# Patient Record
Sex: Female | Born: 1965 | Race: White | Hispanic: No | Smoking: Current every day smoker
Health system: Southern US, Community
[De-identification: ages and names within clinical notes are randomized; demographics above are authoritative.]

## PROBLEM LIST (undated history)

## (undated) DIAGNOSIS — I10 Essential (primary) hypertension: Secondary | ICD-10-CM

---

## 2010-11-21 ENCOUNTER — Emergency Department: Payer: Self-pay | Admitting: Emergency Medicine

## 2021-05-26 ENCOUNTER — Emergency Department: Payer: Self-pay

## 2021-05-26 ENCOUNTER — Inpatient Hospital Stay: Payer: Self-pay

## 2021-05-26 ENCOUNTER — Other Ambulatory Visit: Payer: Self-pay

## 2021-05-26 ENCOUNTER — Inpatient Hospital Stay
Admission: EM | Admit: 2021-05-26 | Discharge: 2021-06-03 | DRG: 871 | Disposition: A | Discharge status: Home / Self Care | Payer: Self-pay | Attending: Internal Medicine | Admitting: Internal Medicine

## 2021-05-26 ENCOUNTER — Encounter: Payer: Self-pay | Admitting: Radiology

## 2021-05-26 DIAGNOSIS — R55 Syncope and collapse: Secondary | ICD-10-CM

## 2021-05-26 DIAGNOSIS — F17210 Nicotine dependence, cigarettes, uncomplicated: Secondary | ICD-10-CM | POA: Diagnosis present

## 2021-05-26 DIAGNOSIS — J9601 Acute respiratory failure with hypoxia: Secondary | ICD-10-CM | POA: Diagnosis present

## 2021-05-26 DIAGNOSIS — R197 Diarrhea, unspecified: Secondary | ICD-10-CM | POA: Diagnosis present

## 2021-05-26 DIAGNOSIS — G4700 Insomnia, unspecified: Secondary | ICD-10-CM | POA: Diagnosis present

## 2021-05-26 DIAGNOSIS — E876 Hypokalemia: Secondary | ICD-10-CM | POA: Diagnosis present

## 2021-05-26 DIAGNOSIS — E162 Hypoglycemia, unspecified: Secondary | ICD-10-CM | POA: Diagnosis present

## 2021-05-26 DIAGNOSIS — Z8249 Family history of ischemic heart disease and other diseases of the circulatory system: Secondary | ICD-10-CM

## 2021-05-26 DIAGNOSIS — Z8042 Family history of malignant neoplasm of prostate: Secondary | ICD-10-CM

## 2021-05-26 DIAGNOSIS — M109 Gout, unspecified: Secondary | ICD-10-CM | POA: Diagnosis present

## 2021-05-26 DIAGNOSIS — R0781 Pleurodynia: Secondary | ICD-10-CM

## 2021-05-26 DIAGNOSIS — Z28310 Unvaccinated for covid-19: Secondary | ICD-10-CM

## 2021-05-26 DIAGNOSIS — J449 Chronic obstructive pulmonary disease, unspecified: Secondary | ICD-10-CM | POA: Diagnosis present

## 2021-05-26 DIAGNOSIS — F17200 Nicotine dependence, unspecified, uncomplicated: Secondary | ICD-10-CM | POA: Diagnosis present

## 2021-05-26 DIAGNOSIS — N390 Urinary tract infection, site not specified: Secondary | ICD-10-CM | POA: Diagnosis present

## 2021-05-26 DIAGNOSIS — Z79899 Other long term (current) drug therapy: Secondary | ICD-10-CM

## 2021-05-26 DIAGNOSIS — J852 Abscess of lung without pneumonia: Secondary | ICD-10-CM

## 2021-05-26 DIAGNOSIS — N3001 Acute cystitis with hematuria: Secondary | ICD-10-CM | POA: Diagnosis present

## 2021-05-26 DIAGNOSIS — A419 Sepsis, unspecified organism: Principal | ICD-10-CM | POA: Diagnosis present

## 2021-05-26 DIAGNOSIS — J90 Pleural effusion, not elsewhere classified: Secondary | ICD-10-CM | POA: Diagnosis present

## 2021-05-26 DIAGNOSIS — I10 Essential (primary) hypertension: Secondary | ICD-10-CM | POA: Diagnosis present

## 2021-05-26 DIAGNOSIS — Z938 Other artificial opening status: Secondary | ICD-10-CM

## 2021-05-26 DIAGNOSIS — J869 Pyothorax without fistula: Secondary | ICD-10-CM | POA: Diagnosis present

## 2021-05-26 DIAGNOSIS — I493 Ventricular premature depolarization: Secondary | ICD-10-CM | POA: Diagnosis present

## 2021-05-26 DIAGNOSIS — Z20822 Contact with and (suspected) exposure to covid-19: Secondary | ICD-10-CM | POA: Diagnosis present

## 2021-05-26 DIAGNOSIS — Z9049 Acquired absence of other specified parts of digestive tract: Secondary | ICD-10-CM

## 2021-05-26 HISTORY — DX: Essential (primary) hypertension: I10

## 2021-05-26 LAB — RESP PANEL BY RT-PCR (FLU A&B, COVID) ARPGX2
Influenza A by PCR: NEGATIVE
Influenza B by PCR: NEGATIVE
SARS Coronavirus 2 by RT PCR: NEGATIVE

## 2021-05-26 LAB — CBC WITH DIFFERENTIAL/PLATELET
Abs Immature Granulocytes: 0.73 10*3/uL — ABNORMAL HIGH (ref 0.00–0.07)
Basophils Absolute: 0.1 10*3/uL (ref 0.0–0.1)
Basophils Relative: 0 %
Eosinophils Absolute: 0 10*3/uL (ref 0.0–0.5)
Eosinophils Relative: 0 %
HCT: 38 % (ref 36.0–46.0)
Hemoglobin: 13.3 g/dL (ref 12.0–15.0)
Immature Granulocytes: 3 %
Lymphocytes Relative: 8 %
Lymphs Abs: 2 10*3/uL (ref 0.7–4.0)
MCH: 33.3 pg (ref 26.0–34.0)
MCHC: 35 g/dL (ref 30.0–36.0)
MCV: 95 fL (ref 80.0–100.0)
Monocytes Absolute: 2 10*3/uL — ABNORMAL HIGH (ref 0.1–1.0)
Monocytes Relative: 8 %
Neutro Abs: 19.4 10*3/uL — ABNORMAL HIGH (ref 1.7–7.7)
Neutrophils Relative %: 81 %
Platelets: 374 10*3/uL (ref 150–400)
RBC: 4 MIL/uL (ref 3.87–5.11)
RDW: 12.4 % (ref 11.5–15.5)
WBC: 24.2 10*3/uL — ABNORMAL HIGH (ref 4.0–10.5)
nRBC: 0 % (ref 0.0–0.2)

## 2021-05-26 LAB — COMPREHENSIVE METABOLIC PANEL
ALT: 19 U/L (ref 0–44)
AST: 18 U/L (ref 15–41)
Albumin: 2.6 g/dL — ABNORMAL LOW (ref 3.5–5.0)
Alkaline Phosphatase: 132 U/L — ABNORMAL HIGH (ref 38–126)
Anion gap: 13 (ref 5–15)
BUN: 8 mg/dL (ref 6–20)
CO2: 32 mmol/L (ref 22–32)
Calcium: 9 mg/dL (ref 8.9–10.3)
Chloride: 85 mmol/L — ABNORMAL LOW (ref 98–111)
Creatinine, Ser: 0.57 mg/dL (ref 0.44–1.00)
GFR, Estimated: >60 mL/min (ref >60)
Glucose, Bld: 136 mg/dL — ABNORMAL HIGH (ref 70–99)
Potassium: 2.4 mmol/L — CL (ref 3.5–5.1)
Sodium: 130 mmol/L — ABNORMAL LOW (ref 135–145)
Total Bilirubin: 0.8 mg/dL (ref 0.3–1.2)
Total Protein: 6.8 g/dL (ref 6.5–8.1)

## 2021-05-26 LAB — URINALYSIS, COMPLETE (UACMP) WITH MICROSCOPIC
Bacteria, UA: NONE SEEN
Bilirubin Urine: NEGATIVE
Glucose, UA: NEGATIVE mg/dL
Ketones, ur: NEGATIVE mg/dL
Nitrite: NEGATIVE
Protein, ur: NEGATIVE mg/dL
Specific Gravity, Urine: 1.033 — ABNORMAL HIGH (ref 1.005–1.030)
pH: 7 (ref 5.0–8.0)

## 2021-05-26 LAB — TROPONIN I (HIGH SENSITIVITY)
Troponin I (High Sensitivity): 29 ng/L — ABNORMAL HIGH (ref <18)
Troponin I (High Sensitivity): 9 ng/L (ref <18)

## 2021-05-26 LAB — MAGNESIUM: Magnesium: 2 mg/dL (ref 1.7–2.4)

## 2021-05-26 LAB — LACTIC ACID, PLASMA
Lactic Acid, Venous: 1.8 mmol/L (ref 0.5–1.9)
Lactic Acid, Venous: 1.9 mmol/L (ref 0.5–1.9)

## 2021-05-26 LAB — LIPASE, BLOOD: Lipase: 21 U/L (ref 11–51)

## 2021-05-26 LAB — D-DIMER, QUANTITATIVE: D-Dimer, Quant: 6.95 ug/mL-FEU — ABNORMAL HIGH (ref 0.00–0.50)

## 2021-05-26 LAB — BRAIN NATRIURETIC PEPTIDE: B Natriuretic Peptide: 364.7 pg/mL — ABNORMAL HIGH (ref 0.0–100.0)

## 2021-05-26 MED ORDER — ENOXAPARIN SODIUM 40 MG/0.4ML IJ SOSY
40.0000 mg | PREFILLED_SYRINGE | INTRAMUSCULAR | Status: DC
  Administered 2021-05-26 – 2021-06-02 (×8): 40 mg via SUBCUTANEOUS
  Filled 2021-05-26 (×8): qty 0.4

## 2021-05-26 MED ORDER — IOHEXOL 350 MG/ML SOLN
75.0000 mL | Freq: Once | INTRAVENOUS | Status: AC | PRN
  Administered 2021-05-26: 75 mL via INTRAVENOUS

## 2021-05-26 MED ORDER — SODIUM CHLORIDE 0.9 % IV SOLN
2.0000 g | INTRAVENOUS | Status: DC
  Administered 2021-05-26 – 2021-05-28 (×3): 2 g via INTRAVENOUS
  Filled 2021-05-26: qty 20
  Filled 2021-05-26: qty 2
  Filled 2021-05-26: qty 20
  Filled 2021-05-26: qty 2

## 2021-05-26 MED ORDER — SODIUM CHLORIDE 0.9 % IV BOLUS
1000.0000 mL | Freq: Once | INTRAVENOUS | Status: AC
  Administered 2021-05-26: 1000 mL via INTRAVENOUS

## 2021-05-26 MED ORDER — VANCOMYCIN HCL IN DEXTROSE 1-5 GM/200ML-% IV SOLN
1000.0000 mg | Freq: Once | INTRAVENOUS | Status: AC
  Administered 2021-05-26: 1000 mg via INTRAVENOUS
  Filled 2021-05-26: qty 200

## 2021-05-26 MED ORDER — MIDAZOLAM HCL 5 MG/5ML IJ SOLN
INTRAMUSCULAR | Status: AC
  Filled 2021-05-26: qty 5

## 2021-05-26 MED ORDER — POTASSIUM CHLORIDE 10 MEQ/100ML IV SOLN
10.0000 meq | INTRAVENOUS | Status: AC
  Administered 2021-05-26 (×4): 10 meq via INTRAVENOUS
  Filled 2021-05-26 (×2): qty 100

## 2021-05-26 MED ORDER — ONDANSETRON HCL 4 MG/2ML IJ SOLN: 4.0000 mg | Freq: Four times a day (QID) | INTRAMUSCULAR | Status: DC | PRN

## 2021-05-26 MED ORDER — ONDANSETRON HCL 4 MG PO TABS: 4.0000 mg | ORAL_TABLET | Freq: Four times a day (QID) | ORAL | Status: DC | PRN

## 2021-05-26 MED ORDER — ONDANSETRON HCL 4 MG/2ML IJ SOLN
4.0000 mg | Freq: Once | INTRAMUSCULAR | Status: AC
  Administered 2021-05-26: 4 mg via INTRAVENOUS
  Filled 2021-05-26: qty 2

## 2021-05-26 MED ORDER — SODIUM CHLORIDE 0.9 % IV SOLN
2.0000 g | Freq: Once | INTRAVENOUS | Status: AC
  Administered 2021-05-26: 2 g via INTRAVENOUS
  Filled 2021-05-26: qty 2

## 2021-05-26 MED ORDER — MIDAZOLAM HCL 2 MG/2ML IJ SOLN
INTRAMUSCULAR | Status: AC | PRN
  Administered 2021-05-26: 1 mg via INTRAVENOUS

## 2021-05-26 MED ORDER — NICOTINE 14 MG/24HR TD PT24
14.0000 mg | MEDICATED_PATCH | Freq: Every day | TRANSDERMAL | Status: DC
  Administered 2021-05-26 – 2021-06-03 (×9): 14 mg via TRANSDERMAL
  Filled 2021-05-26 (×9): qty 1

## 2021-05-26 MED ORDER — SENNA 8.6 MG PO TABS
1.0000 | ORAL_TABLET | Freq: Two times a day (BID) | ORAL | Status: DC
  Administered 2021-05-26 – 2021-05-27 (×3): 8.6 mg via ORAL
  Filled 2021-05-26 (×3): qty 1

## 2021-05-26 MED ORDER — METRONIDAZOLE 500 MG/100ML IV SOLN
500.0000 mg | Freq: Three times a day (TID) | INTRAVENOUS | Status: DC
  Administered 2021-05-26 – 2021-05-29 (×10): 500 mg via INTRAVENOUS
  Filled 2021-05-26 (×12): qty 100

## 2021-05-26 MED ORDER — FENTANYL CITRATE (PF) 100 MCG/2ML IJ SOLN
INTRAMUSCULAR | Status: AC
  Filled 2021-05-26: qty 2

## 2021-05-26 MED ORDER — POTASSIUM CHLORIDE CRYS ER 20 MEQ PO TBCR
40.0000 meq | EXTENDED_RELEASE_TABLET | Freq: Two times a day (BID) | ORAL | Status: AC
  Administered 2021-05-26 (×2): 40 meq via ORAL
  Filled 2021-05-26 (×2): qty 2

## 2021-05-26 MED ORDER — KETOROLAC TROMETHAMINE 15 MG/ML IJ SOLN
15.0000 mg | Freq: Four times a day (QID) | INTRAMUSCULAR | Status: AC | PRN
  Administered 2021-05-26 – 2021-05-29 (×5): 15 mg via INTRAVENOUS
  Filled 2021-05-26 (×7): qty 1

## 2021-05-26 MED ORDER — SODIUM CHLORIDE 0.9 % IV SOLN: 500.0000 mg | INTRAVENOUS | Status: DC

## 2021-05-26 MED ORDER — LACTATED RINGERS IV SOLN: INTRAVENOUS | Status: DC

## 2021-05-26 MED ORDER — LIDOCAINE 5 % EX PTCH
1.0000 | MEDICATED_PATCH | CUTANEOUS | Status: DC
  Administered 2021-05-26 – 2021-06-03 (×9): 1 via TRANSDERMAL
  Filled 2021-05-26 (×10): qty 1

## 2021-05-26 MED ORDER — FENTANYL CITRATE (PF) 100 MCG/2ML IJ SOLN
INTRAMUSCULAR | Status: AC | PRN
  Administered 2021-05-26: 50 ug via INTRAVENOUS

## 2021-05-26 MED ORDER — POTASSIUM CHLORIDE CRYS ER 20 MEQ PO TBCR
40.0000 meq | EXTENDED_RELEASE_TABLET | Freq: Once | ORAL | Status: AC
  Administered 2021-05-26: 40 meq via ORAL
  Filled 2021-05-26: qty 2

## 2021-05-26 MED ORDER — FENTANYL CITRATE (PF) 100 MCG/2ML IJ SOLN
50.0000 ug | Freq: Once | INTRAMUSCULAR | Status: AC
Start: 2021-05-26 — End: 2021-05-26
  Administered 2021-05-26: 50 ug via INTRAVENOUS
  Filled 2021-05-26: qty 2

## 2021-05-26 NOTE — ED Triage Notes (Signed)
Pt to ED with daughter from home POV Pt c/o R flank pain since 1 week ago. Pt had gout attack in feet 3 weeks ago. Daughter states pt has had to sleep in recliner because is SOB since 1 week ago Pt states she feels "winded".  Pt smokes "2-3" cigarettes per day. Hx HTN.  Denies pain, burning with urination. Denies CP.

## 2021-05-26 NOTE — ED Notes (Signed)
Placed pt on 2L oxygen. SPO2 was dropping to 81-83% on RA with good waveform. SPO2 95% on 2 L.

## 2021-05-26 NOTE — Plan of Care (Signed)

## 2021-05-26 NOTE — ED Notes (Signed)
EDP has seen and talked to pt. Pt states before felt sick, was smoking 1/2 pack cigarettes/day.

## 2021-05-26 NOTE — Consult Note (Signed)
PHARMACY -  BRIEF ANTIBIOTIC NOTE   Pharmacy has received consult(s) for Cefepime and Vancomycin from an ED provider.  The patient's profile has been reviewed for ht/wt/allergies/indication/available labs.    One time order(s) placed for Cefepime 2g IV and Vancomycin 1g IV x 1 dose each.  Further antibiotics/pharmacy consults should be ordered by admitting physician if indicated.                       Thank you, Bettey Costa 05/26/2021  11:15 AM

## 2021-05-26 NOTE — ED Notes (Signed)
Pt to CT

## 2021-05-26 NOTE — Procedures (Signed)
Interventional Radiology Procedure Note  Procedure: CT guided right chest tube placement  Findings: Please refer to procedural dictation for full description. Right mid-axillary 14 Fr pigtail thoracotomy placed.  Approximately 60 mL thick, yellow, purulent fluid aspirated immediately.  Sample sent for culture.  Placed to pleurevac. No air leak.  Complications: None immediate.  Estimated Blood Loss: < 5 mL  Recommendations: Keep to wall suction (-40 mmHg). IR will follow. Follow up cultures.   Marliss Coots, MD

## 2021-05-26 NOTE — ED Notes (Signed)
Pt to IR for empyema drainage.

## 2021-05-26 NOTE — H&P (Signed)
History and Physical    TANESSA TIDD HBZ:[id number] DOB: [birth year] DOA: 05/26/2021  PCP: Dortha Kern, MD   Patient coming from: Home  I have personally briefly reviewed patient's old medical records in [facility]  Chief Complaint: Right lateral chest wall pain/shortness of breath  HPI: Tonya Johnson is a 55 y.o. female with medical history significant for gout and hypertension who presents to the emergency room for evaluation of shortness of breath as well as right lateral chest wall pain which she has had for over a week. Patient states that she developed severe pain in the right lateral chest wall and had gone to see her primary care provider in who placed her on medications for suspected kidney stone.  Patient was placed on Flomax and oxycodone without any improvement in her symptoms. Right lateral chest wall pain is pleuritic is described as severe, she rates it an 8 x 10 in intensity at its worst.  Pain is nonradiating.  She also complains of shortness of breath which occurs at rest but is worse with exertion. She decided to present to the ER due to persistence of her symptoms. She complains of anorexia and constipation but denies having any nausea, no vomiting, no fever, no chills, no dizziness, no lightheadedness, no headache, no palpitations, no diaphoresis, no urinary symptoms, diarrhea, no blurred vision or focal deficits. Labs show sodium 130, potassium 2.4, chloride 85, bicarb 32, glucose 136, BUN 8, creatinine 0.57, calcium 9.0, magnesium 2.0, alkaline phosphatase 132, albumin 2.6, lipase 21, AST 18, ALT 19, total protein 6.8, BNP 364, troponin 9, lactic acid 1.9, white count 24.2, hemoglobin 13.3, hematocrit 38, MCV 95, RDW 12.4, platelet count 374, D-dimer 6.95 Respiratory viral panel is negative Chest x-ray reviewed by me shows a large right pleural effusion with air-fluid level. CT angiogram of the chest shows no evidence of a pulmonary embolism. Loculated right  pleural effusion containing an air-fluid level. Findings are suspicious for an empyema. There is associated atelectasis predominantly of the right lower lobe, to a lesser degree of the right middle lobe and minimally of the right upper lobe. Twelve-lead EKG reviewed by me shows sinus tachycardia and PVCs and low voltage QRS.   ED Course: Patient is a 55 year old female who presents to the ER for evaluation of shortness of breath associated with right lateral chest wall pain. Imaging shows empyema IR was consulted by interventional radiology for chest tube insertion and patient received IV Rocephin and vancomycin in the ER. Will be admitted to the hospital for further evaluation.     Review of Systems: As per HPI otherwise all other systems reviewed and negative.    Past Medical History:  Diagnosis Date  . Hypertension        reports that she has been smoking cigarettes. She has been smoking about 0.50 packs per day. She does not have any smokeless tobacco history on file. No history on file for alcohol use and drug use.  No Known Allergies  Family History  Problem Relation Age of Onset  . Hypertension Mother   . Prostate cancer Father       Prior to Admission medications   Not on File    Physical Exam: Vitals:   05/26/21 1300 05/26/21 1349 05/26/21 1355 05/26/21 1357  BP: 107/67 94/63 (!) 105/52 (!) 105/52  Pulse: 94 92 89 89  Resp: (!) 22 (!) 25 (!) 27 (!) 27  Temp:      TempSrc:  SpO2: 96% 97% 97% 97%  Weight:      Height:         Vitals:   05/26/21 1300 05/26/21 1349 05/26/21 1355 05/26/21 1357  BP: 107/67 94/63 (!) 105/52 (!) 105/52  Pulse: 94 92 89 89  Resp: (!) 22 (!) 25 (!) 27 (!) 27  Temp:      TempSrc:      SpO2: 96% 97% 97% 97%  Weight:      Height:          Constitutional: Alert and oriented x 3 .  Acutely ill-appearing.  Short of breath at rest with conversational dyspnea.   HEENT:      Head: Normocephalic and atraumatic.          Eyes: PERLA, EOMI, Conjunctivae are normal. Sclera is non-icteric.       Mouth/Throat: Mucous membranes are moist.       Neck: Supple with no signs of meningismus. Cardiovascular: Regular rate and rhythm. No murmurs, gallops, or rubs. 2+ symmetrical distal pulses are present . No JVD. No LE edema Respiratory:  Tachypnea.decreased air entry at right lung base.  No wheezes, crackles, or rhonchi.  Gastrointestinal: Soft, non tender, and non distended with positive bowel sounds.  Genitourinary: No CVA tenderness. Musculoskeletal: Nontender with normal range of motion in all extremities. No cyanosis, or erythema of extremities. Neurologic:  Face is symmetric. Moving all extremities. No gross focal neurologic deficits  Skin: Skin is warm, dry.  No rash or ulcers Psychiatric: Mood and affect are normal   Labs on Admission: I have personally reviewed following labs and imaging studies  CBC: Recent Labs  Lab 05/26/21 0926  WBC 24.2*  NEUTROABS 19.4*  HGB 13.3  HCT 38.0  MCV 95.0  PLT 374   Basic Metabolic Panel: Recent Labs  Lab 05/26/21 0926 05/26/21 0935  NA 130*  --   K 2.4*  --   CL 85*  --   CO2 32  --   GLUCOSE 136*  --   BUN 8  --   CREATININE 0.57  --   CALCIUM 9.0  --   MG  --  2.0   GFR: Estimated Creatinine Clearance: 70.9 mL/min (by C-G formula based on SCr of 0.57 mg/dL). Liver Function Tests: Recent Labs  Lab 05/26/21 0926  AST 18  ALT 19  ALKPHOS 132*  BILITOT 0.8  PROT 6.8  ALBUMIN 2.6*   Recent Labs  Lab 05/26/21 0926  LIPASE 21   No results for input(s): AMMONIA in the last 168 hours. Coagulation Profile: No results for input(s): INR, PROTIME in the last 168 hours. Cardiac Enzymes: No results for input(s): CKTOTAL, CKMB, CKMBINDEX, TROPONINI in the last 168 hours. BNP (last 3 results) No results for input(s): PROBNP in the last 8760 hours. HbA1C: No results for input(s): HGBA1C in the last 72 hours. CBG: No results for input(s): GLUCAP in  the last 168 hours. Lipid Profile: No results for input(s): CHOL, HDL, LDLCALC, TRIG, CHOLHDL, LDLDIRECT in the last 72 hours. Thyroid Function Tests: No results for input(s): TSH, T4TOTAL, FREET4, T3FREE, THYROIDAB in the last 72 hours. Anemia Panel: No results for input(s): VITAMINB12, FOLATE, FERRITIN, TIBC, IRON, RETICCTPCT in the last 72 hours. Urine analysis:    Component Value Date/Time   COLORURINE YELLOW (A) 05/26/2021 1200   APPEARANCEUR HAZY (A) 05/26/2021 1200   LABSPEC 1.033 (H) 05/26/2021 1200   PHURINE 7.0 05/26/2021 1200   GLUCOSEU NEGATIVE 05/26/2021 1200   HGBUR SMALL (A)  05/26/2021 1200   BILIRUBINUR NEGATIVE 05/26/2021 1200   KETONESUR NEGATIVE 05/26/2021 1200   PROTEINUR NEGATIVE 05/26/2021 1200   NITRITE NEGATIVE 05/26/2021 1200   LEUKOCYTESUR LARGE (A) 05/26/2021 1200    Radiological Exams on Admission: DG Chest 2 View  Result Date: 05/26/2021 CLINICAL DATA:  Shortness of breath for 1 week EXAM: CHEST - 2 VIEW COMPARISON:  None. FINDINGS: Sizable right pleural effusion with air-fluid level laterally. The underlying lung is obscured. Clear left lung. Normal heart size. IMPRESSION: Large right pleural effusion with air-fluid level, question symptoms of empyema. Recommend chest CT. Electronically Signed   By: Marnee Spring M.D.   On: 05/26/2021 10:11   CT Angio Chest PE W and/or Wo Contrast  Result Date: 05/26/2021 CLINICAL DATA:  Short of breath. Patient with right flank pain for 1 week. Current chest radiograph demonstrated right mid to lower lung zone opacity with a right mid lung air-fluid level. EXAM: CT ANGIOGRAPHY CHEST WITH CONTRAST TECHNIQUE: Multidetector CT imaging of the chest was performed using the standard protocol during bolus administration of intravenous contrast. Multiplanar CT image reconstructions and MIPs were obtained to evaluate the vascular anatomy. CONTRAST:  23mL OMNIPAQUE IOHEXOL 350 MG/ML SOLN COMPARISON:  Current chest radiograph.  FINDINGS: Cardiovascular: Pulmonary arteries are well opacified. There is no evidence of a pulmonary embolism. Heart is normal in size and configuration. Trace amount of pericardial fluid. Great vessels are normal in caliber. No aortic dissection or significant atherosclerosis. Arch branch vessels are widely patent. Mediastinum/Nodes: 1.3 cm posterior right thyroid nodule. No follow-up recommended. No neck base or axillary masses or enlarged lymph nodes. No mediastinal or left hilar masses or enlarged lymph nodes. Generalized increased soft tissue along the right hilum suggests mild confluent adenopathy. Trachea and esophagus are unremarkable. Lungs/Pleura: Loculated right pleural effusion in the mid to lower right hemithorax. The lateral portion of this contains an air-fluid level corresponding to the air-fluid level noted on the current chest radiograph. The overlying visceral pleura appears mildly thickened and irregular. Most of the right lower lobe is collapsed/atelectatic. There is milder compressive atelectasis in the right middle lobe and a portion of the lateral right upper lobe adjacent to the pleural effusion. Remainder of the right lung is clear. Left lung is clear. No left pleural effusion. No pneumothorax. Bronchi appear patent.  No evidence of a centrally obstructing mass. Upper Abdomen: No acute findings. 1.6 cm left adrenal nodule, nonspecific. Musculoskeletal: No fracture or acute finding.  No bone lesion. Review of the MIP images confirms the above findings. IMPRESSION: 1. No evidence of a pulmonary embolism. 2. Loculated right pleural effusion containing an air-fluid level. Findings are suspicious for an empyema. There is associated atelectasis predominantly of the right lower lobe, to a lesser degree of the right middle lobe and minimally of the right upper lobe. Electronically Signed   By: Amie Portland M.D.   On: 05/26/2021 10:59     Assessment/Plan Principal Problem:   Empyema, right  (HCC) Active Problems:   Acute lower UTI   HTN (hypertension)   Gout   Nicotine dependence   Hypokalemia   Sepsis (HCC)      Sepsis from empyema As evidenced by tachycardia, tachypnea, leukocytosis and right-sided empyema.  Lactic acid is 1.9 We will place patient empirically on Rocephin 2 g IV daily as well as Flagyl 500 mg IV daily IR has been consulted for chest tube placement We will request pulmonology consult Aggressive IV fluid hydration    UTI Patient noted  to have pyuria Continue empiric antibiotic therapy with Rocephin until urine culture results become available    Hypokalemia Supplement potassium    Nicotine dependence Smoking cessation has been discussed with patient in detail We will place patient on nicotine transdermal patch 14 mg daily   DVT prophylaxis: Lovenox Code Status: full code Family Communication: Greater than 50% of time was spent discussing patient's condition and plan of care with her and her daughter at the bedside.  All questions and concerns have been addressed.  She verbalizes understanding and agrees with the plan. Disposition Plan: Back to previous home environment Consults called: Pulmonology Status: At the time of admission, it appears that the appropriate admission status for this patient is inpatient. This is judged to be reasonable and necessary to provide the required intensity of service to ensure the patient's safety given the presenting symptoms, physical exam findings and initial radiographic and laboratory data in the context of their comorbid conditions. Patient requires inpatient status due to high intensity of service, high risk for further deterioration and high frequency of surveillance required.   Lucile Shutters MD [facility]     05/26/2021, 2:14 PM

## 2021-05-26 NOTE — Consult Note (Signed)
Chief Complaint: Patient was seen in consultation today for right pleural effusion  Referring Physician(s): Artis Delay, MD  Patient Status: [facility] - ED  History of Present Illness: Tonya Johnson is a 55 y.o. female  With history of hypertension presenting to the [facility] ED with progressive right upper flank and pleuritic pain over the past 2 weeks with associated tachycardia, leukocytosis.  CT chest demonstrates complex, gas-containing, large right pleural effusion concerning for empyema.  She has been placed on IV vancomycin and cefepime.    Request for right thoracostomy drain upon admission.  History reviewed. No pertinent past medical history.  Allergies: Patient has no known allergies.  Medications: Prior to Admission medications   Not on File     No family history on file.  Social History   Socioeconomic History  . Marital status: Married    Spouse name: Not on file  . Number of children: Not on file  . Years of education: Not on file  . Highest education level: Not on file  Occupational History  . Not on file  Tobacco Use  . Smoking status: Not on file  . Smokeless tobacco: Not on file  Substance and Sexual Activity  . Alcohol use: Not on file  . Drug use: Not on file  . Sexual activity: Not on file  Other Topics Concern  . Not on file  Social History Narrative  . Not on file   Social Determinants of Health   Financial Resource Strain: Not on file  Food Insecurity: Not on file  Transportation Needs: Not on file  Physical Activity: Not on file  Stress: Not on file  Social Connections: Not on file     Review of Systems: A 12 point ROS discussed and pertinent positives are indicated in the HPI above.  All other systems are negative.  Vital Signs: BP 106/74   Pulse 91   Temp 98.4 F (36.9 C) (Oral)   Resp 20   Ht 5\' 1"  (1.549 m)   Wt 68 kg   SpO2 94%   BMI 28.34 kg/m   Physical Exam Constitutional:      Appearance: She is ill-appearing.   HENT:     Head: Normocephalic.     Mouth/Throat:     Mouth: Mucous membranes are moist.     Comments: MP2  Cardiovascular:     Rate and Rhythm: Regular rhythm. Tachycardia present.  Pulmonary:     Effort: No respiratory distress.  Abdominal:     General: There is no distension.  Musculoskeletal:     Cervical back: No rigidity.  Skin:    General: Skin is warm and dry.  Neurological:     Mental Status: She is alert and oriented to person, place, and time.     Imaging: DG Chest 2 View  Result Date: 05/26/2021 CLINICAL DATA:  Shortness of breath for 1 week EXAM: CHEST - 2 VIEW COMPARISON:  None. FINDINGS: Sizable right pleural effusion with air-fluid level laterally. The underlying lung is obscured. Clear left lung. Normal heart size. IMPRESSION: Large right pleural effusion with air-fluid level, question symptoms of empyema. Recommend chest CT. Electronically Signed   By: 05/28/2021 M.D.   On: 05/26/2021 10:11   CT Angio Chest PE W and/or Wo Contrast  Result Date: 05/26/2021 CLINICAL DATA:  Short of breath. Patient with right flank pain for 1 week. Current chest radiograph demonstrated right mid to lower lung zone opacity with a right mid lung air-fluid level. EXAM:  CT ANGIOGRAPHY CHEST WITH CONTRAST TECHNIQUE: Multidetector CT imaging of the chest was performed using the standard protocol during bolus administration of intravenous contrast. Multiplanar CT image reconstructions and MIPs were obtained to evaluate the vascular anatomy. CONTRAST:  7mL OMNIPAQUE IOHEXOL 350 MG/ML SOLN COMPARISON:  Current chest radiograph. FINDINGS: Cardiovascular: Pulmonary arteries are well opacified. There is no evidence of a pulmonary embolism. Heart is normal in size and configuration. Trace amount of pericardial fluid. Great vessels are normal in caliber. No aortic dissection or significant atherosclerosis. Arch branch vessels are widely patent. Mediastinum/Nodes: 1.3 cm posterior right thyroid  nodule. No follow-up recommended. No neck base or axillary masses or enlarged lymph nodes. No mediastinal or left hilar masses or enlarged lymph nodes. Generalized increased soft tissue along the right hilum suggests mild confluent adenopathy. Trachea and esophagus are unremarkable. Lungs/Pleura: Loculated right pleural effusion in the mid to lower right hemithorax. The lateral portion of this contains an air-fluid level corresponding to the air-fluid level noted on the current chest radiograph. The overlying visceral pleura appears mildly thickened and irregular. Most of the right lower lobe is collapsed/atelectatic. There is milder compressive atelectasis in the right middle lobe and a portion of the lateral right upper lobe adjacent to the pleural effusion. Remainder of the right lung is clear. Left lung is clear. No left pleural effusion. No pneumothorax. Bronchi appear patent.  No evidence of a centrally obstructing mass. Upper Abdomen: No acute findings. 1.6 cm left adrenal nodule, nonspecific. Musculoskeletal: No fracture or acute finding.  No bone lesion. Review of the MIP images confirms the above findings. IMPRESSION: 1. No evidence of a pulmonary embolism. 2. Loculated right pleural effusion containing an air-fluid level. Findings are suspicious for an empyema. There is associated atelectasis predominantly of the right lower lobe, to a lesser degree of the right middle lobe and minimally of the right upper lobe. Electronically Signed   By: Amie Portland M.D.   On: 05/26/2021 10:59    Labs:  CBC: Recent Labs    05/26/21 0926  WBC 24.2*  HGB 13.3  HCT 38.0  PLT 374    COAGS: No results for input(s): INR, APTT in the last 8760 hours.  BMP: Recent Labs    05/26/21 0926  NA 130*  K 2.4*  CL 85*  CO2 32  GLUCOSE 136*  BUN 8  CALCIUM 9.0  CREATININE 0.57  GFRNONAA >60    LIVER FUNCTION TESTS: Recent Labs    05/26/21 0926  BILITOT 0.8  AST 18  ALT 19  ALKPHOS 132*  PROT  6.8  ALBUMIN 2.6*    TUMOR MARKERS: No results for input(s): AFPTM, CEA, CA199, CHROMGRNA in the last 8760 hours.  Assessment and Plan: 55 year old female presenting with right empyema of uncertain etiology.  Plan for CT guided right thoracostomy tube placement with moderate sedation.  Risks and benefits of chest tube placement were discussed with the patient including bleeding, infection, damage to adjacent structures, malfunction of the tube requiring additional procedures and sepsis.  All of the patient's questions were answered, patient is agreeable to proceed. Consent signed and in chart.  Thank you for this interesting consult.  I greatly enjoyed meeting AMELIE CARACCI and look forward to participating in their care.  A copy of this report was sent to the requesting provider on this date.  Electronically Signed: Bennie Dallas, MD 05/26/2021, 1:25 PM   I spent a total of 20 Minutes  in face to face in  clinical consultation, greater than 50% of which was counseling/coordinating care for right empyema.

## 2021-05-26 NOTE — ED Provider Notes (Signed)
[facility] Emergency Department Provider Note  ____________________________________________   Event Date/Time   First MD Initiated Contact with Patient 05/26/21 [id number]     (approximate)  I have reviewed the triage vital signs and the nursing notes.   HISTORY  Chief Complaint Flank Pain    HPI [name] is a 55 y.o. female with hypertension who comes in with right upper abdominal, flank pain.  Patient reports pain for the past week, intermittent, worse with coughing, taking a deep breath.  Patient reports seeing her primary care doctor and they thought it could be a kidney stone so she she was put on pain medication.  Reports that she continues to have significant pain and has been waiting over a week and is lacking any better.  Denies any dysuria.  Patient does report having her gallbladder removed.  She does smoke a half a pack daily but has had worsening cough with this.  No prior history of COPD.  She does not have her COVID vaccines.  She has not been tested  Medical: Hypertension   Allergies Patient has no allergy information on record.  No family history on file.  Social History Daily smoking, occasional alcohol, no drug   Review of Systems Constitutional: No fever/chills Eyes: No visual changes. ENT: No sore throat. Cardiovascular: Positive chest pain Respiratory: Positive shortness of breath, cough Gastrointestinal: No abdominal pain.  No nausea, no vomiting.  No diarrhea.  No constipation. Genitourinary: Negative for dysuria. Musculoskeletal: Positive right flank pain Skin: Negative for rash. Neurological: Negative for headaches, focal weakness or numbness. All other ROS negative ____________________________________________   PHYSICAL EXAM:  VITAL SIGNS: Blood pressure 128/70, pulse (!) 103, temperature 98.4 F (36.9 C), temperature source Oral, resp. rate 15, height $RemoveBe'5\' 1"'fxUgrWGmq$  (1.549 m), weight 68 kg, SpO2 91 %.  Constitutional:  Alert and oriented. Well appearing and in no acute distress. Eyes: Conjunctivae are normal. EOMI. Head: Atraumatic. Nose: No congestion/rhinnorhea. Mouth/Throat: Mucous membranes are moist.   Neck: No stridor. Trachea Midline. FROM Cardiovascular: Tachycardic, regular rhythm. Grossly normal heart sounds.  Good peripheral circulation.  No rash underneath the right breast Respiratory: Normal respiratory effort.  No retractions. Lungs CTAB. Gastrointestinal: Soft and nontender. No distention. No abdominal bruits.  Musculoskeletal: No lower extremity tenderness nor edema.  No joint effusions. Neurologic:  Normal speech and language. No gross focal neurologic deficits are appreciated.  Skin:  Skin is warm, dry and intact. No rash noted. Psychiatric: Mood and affect are normal. Speech and behavior are normal. GU: Deferred   ____________________________________________   LABS (all labs ordered are listed, but only abnormal results are displayed)  Labs Reviewed  RESP PANEL BY RT-PCR (FLU A&B, COVID) ARPGX2  CBC WITH DIFFERENTIAL/PLATELET  COMPREHENSIVE METABOLIC PANEL  LIPASE, BLOOD  BRAIN NATRIURETIC PEPTIDE  URINALYSIS, COMPLETE (UACMP) WITH MICROSCOPIC  D-DIMER, QUANTITATIVE  MAGNESIUM  TROPONIN I (HIGH SENSITIVITY)   ____________________________________________   ED ECG REPORT I, Tonya Johnson, the attending physician, personally viewed and interpreted this ECG.  Sinus tachycardia rate of 99, no ST elevation, no T wave inversion , normal intervals, PVC ____________________________________________  RADIOLOGY Tonya Johnson, personally viewed and evaluated these images (plain radiographs) as part of my medical decision making, as well as reviewing the written report by the radiologist.  ED MD interpretation: Pleural effusion on the right  Official radiology report(s): DG Chest 2 View  Result Date: 05/26/2021 CLINICAL DATA:  Shortness of breath for 1 week EXAM: CHEST - 2  VIEW  COMPARISON:  None. FINDINGS: Sizable right pleural effusion with air-fluid level laterally. The underlying lung is obscured. Clear left lung. Normal heart size. IMPRESSION: Large right pleural effusion with air-fluid level, question symptoms of empyema. Recommend chest CT. Electronically Signed   By: Tonya Johnson M.D.   On: 05/26/2021 10:11   CT Angio Chest PE W and/or Wo Contrast  Result Date: 05/26/2021 CLINICAL DATA:  Short of breath. Patient with right flank pain for 1 week. Current chest radiograph demonstrated right mid to lower lung zone opacity with a right mid lung air-fluid level. EXAM: CT ANGIOGRAPHY CHEST WITH CONTRAST TECHNIQUE: Multidetector CT imaging of the chest was performed using the standard protocol during bolus administration of intravenous contrast. Multiplanar CT image reconstructions and MIPs were obtained to evaluate the vascular anatomy. CONTRAST:  69mL OMNIPAQUE IOHEXOL 350 MG/ML SOLN COMPARISON:  Current chest radiograph. FINDINGS: Cardiovascular: Pulmonary arteries are well opacified. There is no evidence of a pulmonary embolism. Heart is normal in size and configuration. Trace amount of pericardial fluid. Great vessels are normal in caliber. No aortic dissection or significant atherosclerosis. Arch branch vessels are widely patent. Mediastinum/Nodes: 1.3 cm posterior right thyroid nodule. No follow-up recommended. No neck base or axillary masses or enlarged lymph nodes. No mediastinal or left hilar masses or enlarged lymph nodes. Generalized increased soft tissue along the right hilum suggests mild confluent adenopathy. Trachea and esophagus are unremarkable. Lungs/Pleura: Loculated right pleural effusion in the mid to lower right hemithorax. The lateral portion of this contains an air-fluid level corresponding to the air-fluid level noted on the current chest radiograph. The overlying visceral pleura appears mildly thickened and irregular. Most of the right lower lobe is  collapsed/atelectatic. There is milder compressive atelectasis in the right middle lobe and a portion of the lateral right upper lobe adjacent to the pleural effusion. Remainder of the right lung is clear. Left lung is clear. No left pleural effusion. No pneumothorax. Bronchi appear patent.  No evidence of a centrally obstructing mass. Upper Abdomen: No acute findings. 1.6 cm left adrenal nodule, nonspecific. Musculoskeletal: No fracture or acute finding.  No bone lesion. Review of the MIP images confirms the above findings. IMPRESSION: 1. No evidence of a pulmonary embolism. 2. Loculated right pleural effusion containing an air-fluid level. Findings are suspicious for an empyema. There is associated atelectasis predominantly of the right lower lobe, to a lesser degree of the right middle lobe and minimally of the right upper lobe. Electronically Signed   By: Lajean Manes M.D.   On: 05/26/2021 10:59    ____________________________________________   PROCEDURES  Procedure(s) performed (including Critical Care):  .1-3 Lead EKG Interpretation Performed by: Tonya Ferry, MD Authorized by: Tonya Ballinger, MD     Interpretation: abnormal     ECG rate:  100s   ECG rate assessment: tachycardic     Rhythm: sinus tachycardia     Ectopy: PVCs     Conduction: normal    .Critical Care Performed by: Tonya Traver, MD Authorized by: Tonya Bellefonte, MD   Critical care provider statement:    Critical care time (minutes):  45   Critical care was necessary to treat or prevent imminent or life-threatening deterioration of the following conditions:  Sepsis and respiratory failure   Critical care was time spent personally by me on the following activities:  Discussions with consultants, evaluation of patient's response to treatment, examination of patient, ordering and performing treatments and interventions, ordering and review of laboratory  studies, ordering and review of radiographic studies, pulse oximetry,  re-evaluation of patient's condition, obtaining history from patient or surrogate and review of old charts     ____________________________________________   INITIAL IMPRESSION / ASSESSMENT AND PLAN / ED COURSE  SIMREN POPSON was evaluated in Emergency Department on 05/26/2021 for the symptoms described in the history of present illness. She was evaluated in the context of the global COVID-19 pandemic, which necessitated consideration that the patient might be at risk for infection with the SARS-CoV-2 virus that causes COVID-19. Institutional protocols and algorithms that pertain to the evaluation of patients at risk for COVID-19 are in a state of rapid change based on information released by regulatory bodies including the CDC and federal and state organizations. These policies and algorithms were followed during the patient's care in the ED.    Patient is a 55 year old who comes in with right upper flank, underneath her right breast pain.  Will get cardiac markers to evaluate for ACS.  We will keep on the monitor to evaluate for arrhythmia.  Patient is already had her gallbladder removed but will get LFTs, lipase to evaluate for retained stone.  Consider the possibility of PE and will get D-dimer.  Will get COVID swab and chest x-ray to evaluate for pneumothorax, pneumonia We will give some IV fentanyl, IV Zofran, lidocaine patch to help with pain.  11:10 AM  Chest x-ray is consistent with pleural effusion on the right and they recommended a CT scan.  D-dimer was elevated so we will get CT PE.  CT scans without evidence of pulmonary embolism but concern for possible empyema.  Patient met sepsis criteria with her white count and her elevated heart rate therefore sepsis alert was called I will put patient on broad antibiotics with Vanco and cefepime due to concern for empyema.  Will discuss with IR to see if they can put in chest tube.  Will discuss with the hospital team for admission.  Patient had  to be placed on 2 L of oxygen due to sats in the mid 80s.  11:37 AM lactate was normal.  Patient does not need fluid resuscitation's given normal vital signs.  Discussed with Dr. Serafina Royals from IR and they will be able to place chest tube.  Will discuss with hospital team for admission            ____________________________________________   FINAL CLINICAL IMPRESSION(S) / ED DIAGNOSES   Final diagnoses:  Acute respiratory failure with hypoxia (HCC)  Empyema lung ([place])  Sepsis, due to unspecified organism, unspecified whether acute organ dysfunction present ([place])      MEDICATIONS GIVEN DURING THIS VISIT:  Medications  lidocaine (LIDODERM) 5 % 1 patch (1 patch Transdermal Patch Applied 05/26/21 1030)  potassium chloride 10 mEq in 100 mL IVPB (has no administration in time range)  potassium chloride SA (KLOR-CON) CR tablet 40 mEq (has no administration in time range)  vancomycin (VANCOCIN) IVPB 1000 mg/200 mL premix (has no administration in time range)  ceFEPIme (MAXIPIME) 2 g in sodium chloride 0.9 % 100 mL IVPB (has no administration in time range)  fentaNYL (SUBLIMAZE) injection 50 mcg (50 mcg Intravenous Given 05/26/21 1034)  ondansetron (ZOFRAN) injection 4 mg (4 mg Intravenous Given 05/26/21 1030)  iohexol (OMNIPAQUE) 350 MG/ML injection 75 mL (75 mLs Intravenous Contrast Given 05/26/21 1042)     ED Discharge Orders    None       Note:  This document was prepared using Dragon voice recognition  software and may include unintentional dictation errors.   Tonya Urbank, MD 05/26/21 [phone]

## 2021-05-26 NOTE — Progress Notes (Signed)
CODE SEPSIS - PHARMACY COMMUNICATION  **Broad Spectrum Antibiotics should be administered within 1 hour of Sepsis diagnosis**  Time Code Sepsis Called/Page Received: 1113  Antibiotics Ordered: Vancomycin,Cefepime  Time of 1st antibiotic administration: 1114  Additional action taken by pharmacy: none  If necessary, Name of Provider/Nurse Contacted: n/a    Bettey Costa ,PharmD Clinical Pharmacist  05/26/2021  11:28 AM

## 2021-05-27 DIAGNOSIS — R0781 Pleurodynia: Secondary | ICD-10-CM

## 2021-05-27 DIAGNOSIS — M1A9XX0 Chronic gout, unspecified, without tophus (tophi): Secondary | ICD-10-CM

## 2021-05-27 DIAGNOSIS — N3001 Acute cystitis with hematuria: Secondary | ICD-10-CM

## 2021-05-27 DIAGNOSIS — G4700 Insomnia, unspecified: Secondary | ICD-10-CM

## 2021-05-27 DIAGNOSIS — A419 Sepsis, unspecified organism: Principal | ICD-10-CM

## 2021-05-27 DIAGNOSIS — R197 Diarrhea, unspecified: Secondary | ICD-10-CM

## 2021-05-27 LAB — BODY FLUID CELL COUNT WITH DIFFERENTIAL
Eos, Fluid: 0 %
Lymphs, Fluid: 2 %
Monocyte-Macrophage-Serous Fluid: 1 %
Neutrophil Count, Fluid: 97 %
Total Nucleated Cell Count, Fluid: 62890 cu mm

## 2021-05-27 LAB — CBC
HCT: 32.5 % — ABNORMAL LOW (ref 36.0–46.0)
Hemoglobin: 11 g/dL — ABNORMAL LOW (ref 12.0–15.0)
MCH: 33.1 pg (ref 26.0–34.0)
MCHC: 33.8 g/dL (ref 30.0–36.0)
MCV: 97.9 fL (ref 80.0–100.0)
Platelets: 353 10*3/uL (ref 150–400)
RBC: 3.32 MIL/uL — ABNORMAL LOW (ref 3.87–5.11)
RDW: 12.8 % (ref 11.5–15.5)
WBC: 20.2 10*3/uL — ABNORMAL HIGH (ref 4.0–10.5)
nRBC: 0 % (ref 0.0–0.2)

## 2021-05-27 LAB — ALBUMIN, PLEURAL OR PERITONEAL FLUID: Albumin, Fluid: 1.8 g/dL

## 2021-05-27 LAB — PROTIME-INR
INR: 1.1 (ref 0.8–1.2)
Prothrombin Time: 14.6 seconds (ref 11.4–15.2)

## 2021-05-27 LAB — BASIC METABOLIC PANEL
Anion gap: 10 (ref 5–15)
BUN: 11 mg/dL (ref 6–20)
CO2: 28 mmol/L (ref 22–32)
Calcium: 8.8 mg/dL — ABNORMAL LOW (ref 8.9–10.3)
Chloride: 96 mmol/L — ABNORMAL LOW (ref 98–111)
Creatinine, Ser: 0.53 mg/dL (ref 0.44–1.00)
GFR, Estimated: >60 mL/min (ref >60)
Glucose, Bld: 145 mg/dL — ABNORMAL HIGH (ref 70–99)
Potassium: 3.6 mmol/L (ref 3.5–5.1)
Sodium: 134 mmol/L — ABNORMAL LOW (ref 135–145)

## 2021-05-27 LAB — CORTISOL-AM, BLOOD: Cortisol - AM: 31.1 ug/dL — ABNORMAL HIGH (ref 6.7–22.6)

## 2021-05-27 LAB — HIV ANTIBODY (ROUTINE TESTING W REFLEX): HIV Screen 4th Generation wRfx: NONREACTIVE

## 2021-05-27 LAB — LACTATE DEHYDROGENASE, PLEURAL OR PERITONEAL FLUID: LD, Fluid: >10000 U/L — ABNORMAL HIGH (ref 3–23)

## 2021-05-27 LAB — GLUCOSE, PLEURAL OR PERITONEAL FLUID: Glucose, Fluid: <20 mg/dL

## 2021-05-27 LAB — PROCALCITONIN: Procalcitonin: 0.46 ng/mL

## 2021-05-27 MED ORDER — ALLOPURINOL 100 MG PO TABS
100.0000 mg | ORAL_TABLET | Freq: Every day | ORAL | Status: DC
  Administered 2021-05-27 – 2021-06-03 (×8): 100 mg via ORAL
  Filled 2021-05-27 (×8): qty 1

## 2021-05-27 MED ORDER — METHYLPREDNISOLONE SODIUM SUCC 40 MG IJ SOLR
40.0000 mg | Freq: Every day | INTRAMUSCULAR | Status: DC
  Administered 2021-05-27 – 2021-06-03 (×8): 40 mg via INTRAVENOUS
  Filled 2021-05-27 (×8): qty 1

## 2021-05-27 MED ORDER — ZOLPIDEM TARTRATE 5 MG PO TABS: 5.0000 mg | ORAL_TABLET | Freq: Every evening | ORAL | Status: DC | PRN

## 2021-05-27 MED ORDER — TRAZODONE HCL 50 MG PO TABS
50.0000 mg | ORAL_TABLET | Freq: Every day | ORAL | Status: DC
  Administered 2021-05-27 – 2021-06-02 (×7): 50 mg via ORAL
  Filled 2021-05-27 (×7): qty 1

## 2021-05-27 NOTE — Progress Notes (Signed)
Obtained 20 ml from chest tube and walked specimen to lab

## 2021-05-27 NOTE — Progress Notes (Addendum)
Mobility Specialist - Progress Note   05/27/21 1558  Mobility  Activity Ambulated in hall  Level of Assistance Standby assist, set-up cues, supervision of patient - no hands on  Assistive Device None (IV pole)  Distance Ambulated (ft) 180 ft  Mobility Ambulated with assistance in hallway  Mobility Response Tolerated well  Mobility performed by Mobility specialist  $Mobility charge 1 Mobility   Pre-mobility: 83 HR, 97% SpO2 During mobility: 96 HR, 84% SpO2 Post-mobility: 82 HR, 100% SpO2   Pt semi-supine upon arrival, motivated for session. Pt utilizing 2L on arrival. RN entered to assist with chest tube. Pt sat EOB with minA. Denied dizziness upon sitting. Pt ambulated in hallway with supervision, pushing IV pole. No LOB. Denied SOB throughout session, however O2 desat to a low of 84% during ambulation. A short standing rest break was initiated as mobility increased O2 to 3L, sats rebounding to mid 90s. O2 did desat a second time during last 20' (87%). Overall, pt tolerated well. Pt denied fatigue after activity, "no more than I normally am". Pt left in bed, back on 2L.    Filiberto Pinks Mobility Specialist 05/27/21, 4:07 PM

## 2021-05-27 NOTE — Progress Notes (Signed)
Chief Complaint: Patient was seen today for right empyema drain  Supervising Physician: Marliss Coots  Patient Status: [facility] - In-pt  Subjective: S/p right chest tube placement for empyema. Pt sitting on commode, c/o cough and some discomfort on right chest, near drain site.  Objective: Physical Exam: BP 126/79 (BP Location: Right Arm)   Pulse 83   Temp 98.5 F (36.9 C) (Oral)   Resp (!) 24   Ht 5\' 1"  (1.549 m)   Wt 68 kg   SpO2 93%   BMI 28.34 kg/m  (R)chest tube intact, site/dressing clean. Output purulent   Current Facility-Administered Medications:  .  allopurinol (ZYLOPRIM) tablet 100 mg, 100 mg, Oral, Daily, , Richard, MD, 100 mg at 05/27/21 1509 .  cefTRIAXone (ROCEPHIN) 2 g in sodium chloride 0.9 % 100 mL IVPB, 2 g, Intravenous, Q24H, Agbata, Tochukwu, MD, Last Rate: 200 mL/hr at 05/26/21 2153, 2 g at 05/26/21 2153 .  enoxaparin (LOVENOX) injection 40 mg, 40 mg, Subcutaneous, Q24H, Agbata, Tochukwu, MD, 40 mg at 05/26/21 2152 .  ketorolac (TORADOL) 15 MG/ML injection 15 mg, 15 mg, Intravenous, Q6H PRN, Agbata, Tochukwu, MD, 15 mg at 05/26/21 2244 .  lactated ringers infusion, , Intravenous, Continuous, Agbata, Tochukwu, MD, Last Rate: 125 mL/hr at 05/27/21 1144, New Bag at 05/27/21 1144 .  lidocaine (LIDODERM) 5 % 1 patch, 1 patch, Transdermal, Q24H, 05/29/21, MD, 1 patch at 05/27/21 [phone] .  methylPREDNISolone sodium succinate (SOLU-MEDROL) 40 mg/mL injection 40 mg, 40 mg, Intravenous, Daily, 9449, Richard, MD, 40 mg at 05/27/21 1141 .  metroNIDAZOLE (FLAGYL) IVPB 500 mg, 500 mg, Intravenous, Q8H, Agbata, Tochukwu, MD, Last Rate: 100 mL/hr at 05/27/21 1512, 500 mg at 05/27/21 1512 .  nicotine (NICODERM CQ - dosed in mg/24 hours) patch 14 mg, 14 mg, Transdermal, Daily, Agbata, Tochukwu, MD, 14 mg at 05/27/21 0946 .  ondansetron (ZOFRAN) tablet 4 mg, 4 mg, Oral, Q6H PRN **OR** ondansetron (ZOFRAN) injection 4 mg, 4 mg, Intravenous, Q6H PRN, Agbata,  Tochukwu, MD .  traZODone (DESYREL) tablet 50 mg, 50 mg, Oral, QHS, 05/29/21, MD  Labs: CBC Recent Labs    05/26/21 0926 05/27/21 0644  WBC 24.2* 20.2*  HGB 13.3 11.0*  HCT 38.0 32.5*  PLT 374 353   BMET Recent Labs    05/26/21 0926 05/27/21 0644  NA 130* 134*  K 2.4* 3.6  CL 85* 96*  CO2 32 28  GLUCOSE 136* 145*  BUN 8 11  CREATININE 0.57 0.53  CALCIUM 9.0 8.8*   LFT Recent Labs    05/26/21 0926  PROT 6.8  ALBUMIN 2.6*  AST 18  ALT 19  ALKPHOS 132*  BILITOT 0.8  LIPASE 21   PT/INR Recent Labs    05/27/21 0644  LABPROT 14.6  INR 1.1     Studies/Results: DG Chest 2 View  Result Date: 05/26/2021 CLINICAL DATA:  Shortness of breath for 1 week EXAM: CHEST - 2 VIEW COMPARISON:  None. FINDINGS: Sizable right pleural effusion with air-fluid level laterally. The underlying lung is obscured. Clear left lung. Normal heart size. IMPRESSION: Large right pleural effusion with air-fluid level, question symptoms of empyema. Recommend chest CT. Electronically Signed   By: 05/28/2021 M.D.   On: 05/26/2021 10:11   CT Angio Chest PE W and/or Wo Contrast  Result Date: 05/26/2021 CLINICAL DATA:  Short of breath. Patient with right flank pain for 1 week. Current chest radiograph demonstrated right mid to lower lung zone opacity with a  right mid lung air-fluid level. EXAM: CT ANGIOGRAPHY CHEST WITH CONTRAST TECHNIQUE: Multidetector CT imaging of the chest was performed using the standard protocol during bolus administration of intravenous contrast. Multiplanar CT image reconstructions and MIPs were obtained to evaluate the vascular anatomy. CONTRAST:  47mL OMNIPAQUE IOHEXOL 350 MG/ML SOLN COMPARISON:  Current chest radiograph. FINDINGS: Cardiovascular: Pulmonary arteries are well opacified. There is no evidence of a pulmonary embolism. Heart is normal in size and configuration. Trace amount of pericardial fluid. Great vessels are normal in caliber. No aortic dissection  or significant atherosclerosis. Arch branch vessels are widely patent. Mediastinum/Nodes: 1.3 cm posterior right thyroid nodule. No follow-up recommended. No neck base or axillary masses or enlarged lymph nodes. No mediastinal or left hilar masses or enlarged lymph nodes. Generalized increased soft tissue along the right hilum suggests mild confluent adenopathy. Trachea and esophagus are unremarkable. Lungs/Pleura: Loculated right pleural effusion in the mid to lower right hemithorax. The lateral portion of this contains an air-fluid level corresponding to the air-fluid level noted on the current chest radiograph. The overlying visceral pleura appears mildly thickened and irregular. Most of the right lower lobe is collapsed/atelectatic. There is milder compressive atelectasis in the right middle lobe and a portion of the lateral right upper lobe adjacent to the pleural effusion. Remainder of the right lung is clear. Left lung is clear. No left pleural effusion. No pneumothorax. Bronchi appear patent.  No evidence of a centrally obstructing mass. Upper Abdomen: No acute findings. 1.6 cm left adrenal nodule, nonspecific. Musculoskeletal: No fracture or acute finding.  No bone lesion. Review of the MIP images confirms the above findings. IMPRESSION: 1. No evidence of a pulmonary embolism. 2. Loculated right pleural effusion containing an air-fluid level. Findings are suspicious for an empyema. There is associated atelectasis predominantly of the right lower lobe, to a lesser degree of the right middle lobe and minimally of the right upper lobe. Electronically Signed   By: Amie Portland M.D.   On: 05/26/2021 10:59   CT IMAGE GUIDED DRAINAGE BY PERCUTANEOUS CATHETER  Result Date: 05/26/2021 INDICATION: 55 year old female with large right pleural effusion, suspicious for empyema. EXAM: CT IMAGE GUIDED DRAINAGE BY PERCUTANEOUS CATHETER COMPARISON:  CT chest from earlier the same day MEDICATIONS: The patient is  currently admitted to the hospital and receiving intravenous antibiotics. The antibiotics were administered within an appropriate time frame prior to the initiation of the procedure. ANESTHESIA/SEDATION: Moderate (conscious) sedation was employed during this procedure. A total of Versed 1 mg and Fentanyl 50 mcg was administered intravenously. Moderate Sedation Time: 15 minutes. The patient's level of consciousness and vital signs were monitored continuously by radiology nursing throughout the procedure under my direct supervision. CONTRAST:  None COMPLICATIONS: None immediate. PROCEDURE: Informed written consent was obtained from the patient after a discussion of the risks, benefits and alternatives to treatment. The patient was placed supine, partially left lateral decubitus on the CT gantry and a pre procedural CT was performed re-demonstrating the known fluid collection within the right pleural space. The procedure was planned. A timeout was performed prior to the initiation of the procedure. The right posterolateral and inferior thorax was prepped and draped in the usual sterile fashion. The overlying soft tissues were anesthetized with 1% lidocaine with epinephrine. Appropriate trajectory was planned with the use of a 22 gauge spinal needle. An 18 gauge trocar needle was advanced into the pleural space and a short Amplatz super stiff wire was coiled within the collection. Appropriate positioning was confirmed with  a limited CT scan. The tract was serially dilated allowing placement of a 14 French all-purpose drainage catheter. Appropriate positioning was confirmed with a limited postprocedural CT scan. A total of approximately 60 ml of opaque yellow, purulence fluid was aspirated. Samples were sent to the lab for fluid analysis. The tube was connected to a pleurovac and sutured in place. A dressing was placed. The patient tolerated the procedure well without immediate post procedural complication. IMPRESSION:  Successful CT guided placement of a 69 French all purpose drain catheter into the right empyema with aspiration of approximately 60 mL of purulence fluid. Samples were sent to the laboratory as requested by the ordering clinical team. Marliss Coots, MD [facility] Radiology Electronically Signed   By: Marliss Coots MD   On: 05/26/2021 14:44    Assessment/Plan: (R)empyema s/p perc chest drain yesterday Continues to have purulent output WBC down to 20.2 from 24.2 Continue aggressive flushing to promote output. May need thoracic surgery consult. IR following    LOS: 1 day   I spent a total of 20 minutes in face to face in clinical consultation, greater than 50% of which was counseling/coordinating care for (R)chest empyema drain  Brayton El PA-C 05/27/2021 3:12 PM

## 2021-05-27 NOTE — Progress Notes (Signed)
Patient ID: Tonya Johnson, female   DOB: 13-May-1966, 55 y.o.   MRN: [id number] [facility] PROGRESS NOTE  Tonya Johnson WUJ:[id number] DOB: 13-May-1966 DOA: 05/26/2021 PCP: Dortha KernBliss, Laura K, MD  HPI/Subjective: Patient with a history of hypertension.  For the past week not feeling well.  Initially had a cough and then started having severe pain in her side.  Patient could not take it anymore and ended up coming into the hospital and found to have an empyema.  Chest tube placed on 05/26/2021 by interventional radiology.  Patient with cough and side pain.  Objective: Vitals:   05/27/21 0754 05/27/21 1119  BP: 117/82 126/79  Pulse: 79 83  Resp: (!) 24   Temp: 98.7 F (37.1 C) 98.5 F (36.9 C)  SpO2:  93%    Intake/Output Summary (Last 24 hours) at 05/27/2021 1442 Last data filed at 05/27/2021 1134 Gross per 24 hour  Intake 534.95 ml  Output 105 ml  Net 429.95 ml   Filed Weights   05/26/21 0923  Weight: 68 kg    ROS: Review of Systems  Respiratory: Positive for cough. Negative for shortness of breath.   Cardiovascular: Positive for chest pain.  Gastrointestinal: Positive for diarrhea. Negative for abdominal pain, nausea and vomiting.   Exam: Physical Exam HENT:     Head: Normocephalic.     Mouth/Throat:     Pharynx: No oropharyngeal exudate.  Eyes:     General: Lids are normal.     Conjunctiva/sclera: Conjunctivae normal.  Cardiovascular:     Rate and Rhythm: Normal rate and regular rhythm.     Heart sounds: Normal heart sounds and S1 normal.  Pulmonary:     Breath sounds: Examination of the right-middle field reveals decreased breath sounds. Examination of the right-lower field reveals decreased breath sounds and rhonchi. Decreased breath sounds and rhonchi present. No wheezing or rales.  Abdominal:     Palpations: Abdomen is soft.     Tenderness: There is no abdominal tenderness.  Musculoskeletal:     Right lower leg: No swelling.     Left lower leg: No swelling.   Skin:    General: Skin is warm.     Findings: No rash.  Neurological:     Mental Status: She is alert and oriented to person, place, and time.       Data Reviewed: Basic Metabolic Panel: Recent Labs  Lab 05/26/21 0926 05/26/21 0935 05/27/21 0644  NA 130*  --  134*  K 2.4*  --  3.6  CL 85*  --  96*  CO2 32  --  28  GLUCOSE 136*  --  145*  BUN 8  --  11  CREATININE 0.57  --  0.53  CALCIUM 9.0  --  8.8*  MG  --  2.0  --    Liver Function Tests: Recent Labs  Lab 05/26/21 0926  AST 18  ALT 19  ALKPHOS 132*  BILITOT 0.8  PROT 6.8  ALBUMIN 2.6*   Recent Labs  Lab 05/26/21 0926  LIPASE 21   CBC: Recent Labs  Lab 05/26/21 0926 05/27/21 0644  WBC 24.2* 20.2*  NEUTROABS 19.4*  --   HGB 13.3 11.0*  HCT 38.0 32.5*  MCV 95.0 97.9  PLT 374 353   BNP (last 3 results) Recent Labs    05/26/21 0927  BNP 364.7*      Recent Results (from the past 240 hour(s))  Resp Panel by RT-PCR (Flu A&B, Covid) Nasopharyngeal Swab  Status: None   Collection Time: 05/26/21  9:40 AM   Specimen: Nasopharyngeal Swab; Nasopharyngeal(NP) swabs in vial transport medium  Result Value Ref Range Status   SARS Coronavirus 2 by RT PCR NEGATIVE NEGATIVE Final    Comment: (NOTE) SARS-CoV-2 target nucleic acids are NOT DETECTED.  The SARS-CoV-2 RNA is generally detectable in upper respiratory specimens during the acute phase of infection. The lowest concentration of SARS-CoV-2 viral copies this assay can detect is 138 copies/mL. A negative result does not preclude SARS-Cov-2 infection and should not be used as the sole basis for treatment or other patient management decisions. A negative result may occur with  improper specimen collection/handling, submission of specimen other than nasopharyngeal swab, presence of viral mutation(s) within the areas targeted by this assay, and inadequate number of viral copies(<138 copies/mL). A negative result must be combined with clinical  observations, patient history, and epidemiological information. The expected result is Negative.  Fact Sheet for Patients:  BloggerCourse.com  Fact Sheet for Healthcare Providers:  SeriousBroker.it  This test is no t yet approved or cleared by the [place] FDA and  has been authorized for detection and/or diagnosis of SARS-CoV-2 by FDA under an Emergency Use Authorization (EUA). This EUA will remain  in effect (meaning this test can be used) for the duration of the COVID-19 declaration under Section 564(b)(1) of the Act, 21 U.S.C.section 360bbb-3(b)(1), unless the authorization is terminated  or revoked sooner.       Influenza A by PCR NEGATIVE NEGATIVE Final   Influenza B by PCR NEGATIVE NEGATIVE Final    Comment: (NOTE) The Xpert Xpress SARS-CoV-2/FLU/RSV plus assay is intended as an aid in the diagnosis of influenza from Nasopharyngeal swab specimens and should not be used as a sole basis for treatment. Nasal washings and aspirates are unacceptable for Xpert Xpress SARS-CoV-2/FLU/RSV testing.  Fact Sheet for Patients: BloggerCourse.com  Fact Sheet for Healthcare Providers: SeriousBroker.it  This test is not yet approved or cleared by the [place] FDA and has been authorized for detection and/or diagnosis of SARS-CoV-2 by FDA under an Emergency Use Authorization (EUA). This EUA will remain in effect (meaning this test can be used) for the duration of the COVID-19 declaration under Section 564(b)(1) of the Act, 21 U.S.C. section 360bbb-3(b)(1), unless the authorization is terminated or revoked.  Performed at [facility], [address] [place], [place] [address]   Blood culture (routine x 2)     Status: None (Preliminary result)   Collection Time: 05/26/21 10:08 AM   Specimen: BLOOD  Result Value Ref Range Status   Specimen Description BLOOD BLOOD  RIGHT FOREARM  Final   Special Requests   Final    BOTTLES DRAWN AEROBIC AND ANAEROBIC Blood Culture results may not be optimal due to an excessive volume of blood received in culture bottles   Culture   Final    NO GROWTH < 24 HOURS Performed at [facility], [address], [place], [place] [address]    Report Status PENDING  Incomplete  Blood culture (routine x 2)     Status: None (Preliminary result)   Collection Time: 05/26/21 10:08 AM   Specimen: BLOOD  Result Value Ref Range Status   Specimen Description BLOOD BLOOD RIGHT ARM  Final   Special Requests   Final    BOTTLES DRAWN AEROBIC AND ANAEROBIC Blood Culture adequate volume   Culture   Final    NO GROWTH < 24 HOURS Performed at [facility], [address] [place], [place] [address]    Report Status PENDING  Incomplete  Aerobic/Anaerobic Culture (surgical/deep wound)     Status: None (Preliminary result)   Collection Time: 05/26/21  2:26 PM   Specimen: Abscess  Result Value Ref Range Status   Specimen Description ABSCESS RIGHT PLEURAL  Final   Special Requests SYRINGE  Final   Gram Stain PENDING  Incomplete   Culture   Final    NO GROWTH < 12 HOURS Performed at [facility], [address], [place], [place] [address]    Report Status PENDING  Incomplete     Studies: DG Chest 2 View  Result Date: 05/26/2021 CLINICAL DATA:  Shortness of breath for 1 week EXAM: CHEST - 2 VIEW COMPARISON:  None. FINDINGS: Sizable right pleural effusion with air-fluid level laterally. The underlying lung is obscured. Clear left lung. Normal heart size. IMPRESSION: Large right pleural effusion with air-fluid level, question symptoms of empyema. Recommend chest CT. Electronically Signed   By: Marnee Spring M.D.   On: 05/26/2021 10:11   CT Angio Chest PE W and/or Wo Contrast  Result Date: 05/26/2021 CLINICAL DATA:  Short of breath. Patient with right flank pain for 1 week. Current chest radiograph demonstrated  right mid to lower lung zone opacity with a right mid lung air-fluid level. EXAM: CT ANGIOGRAPHY CHEST WITH CONTRAST TECHNIQUE: Multidetector CT imaging of the chest was performed using the standard protocol during bolus administration of intravenous contrast. Multiplanar CT image reconstructions and MIPs were obtained to evaluate the vascular anatomy. CONTRAST:  55mL OMNIPAQUE IOHEXOL 350 MG/ML SOLN COMPARISON:  Current chest radiograph. FINDINGS: Cardiovascular: Pulmonary arteries are well opacified. There is no evidence of a pulmonary embolism. Heart is normal in size and configuration. Trace amount of pericardial fluid. Great vessels are normal in caliber. No aortic dissection or significant atherosclerosis. Arch branch vessels are widely patent. Mediastinum/Nodes: 1.3 cm posterior right thyroid nodule. No follow-up recommended. No neck base or axillary masses or enlarged lymph nodes. No mediastinal or left hilar masses or enlarged lymph nodes. Generalized increased soft tissue along the right hilum suggests mild confluent adenopathy. Trachea and esophagus are unremarkable. Lungs/Pleura: Loculated right pleural effusion in the mid to lower right hemithorax. The lateral portion of this contains an air-fluid level corresponding to the air-fluid level noted on the current chest radiograph. The overlying visceral pleura appears mildly thickened and irregular. Most of the right lower lobe is collapsed/atelectatic. There is milder compressive atelectasis in the right middle lobe and a portion of the lateral right upper lobe adjacent to the pleural effusion. Remainder of the right lung is clear. Left lung is clear. No left pleural effusion. No pneumothorax. Bronchi appear patent.  No evidence of a centrally obstructing mass. Upper Abdomen: No acute findings. 1.6 cm left adrenal nodule, nonspecific. Musculoskeletal: No fracture or acute finding.  No bone lesion. Review of the MIP images confirms the above findings.  IMPRESSION: 1. No evidence of a pulmonary embolism. 2. Loculated right pleural effusion containing an air-fluid level. Findings are suspicious for an empyema. There is associated atelectasis predominantly of the right lower lobe, to a lesser degree of the right middle lobe and minimally of the right upper lobe. Electronically Signed   By: Amie Portland M.D.   On: 05/26/2021 10:59   CT IMAGE GUIDED DRAINAGE BY PERCUTANEOUS CATHETER  Result Date: 05/26/2021 INDICATION: 55 year old female with large right pleural effusion, suspicious for empyema. EXAM: CT IMAGE GUIDED DRAINAGE BY PERCUTANEOUS CATHETER COMPARISON:  CT chest from earlier the same day MEDICATIONS: The patient is currently admitted to the hospital and receiving intravenous antibiotics. The antibiotics were administered within an appropriate time frame prior to the initiation of the procedure. ANESTHESIA/SEDATION: Moderate (conscious) sedation was employed during this procedure. A total of Versed 1 mg and Fentanyl 50 mcg was administered intravenously. Moderate Sedation Time: 15 minutes. The patient's level of consciousness and vital signs were monitored continuously by radiology nursing throughout the procedure under my direct supervision. CONTRAST:  None COMPLICATIONS: None immediate. PROCEDURE: Informed written consent was obtained from the patient after a discussion of the risks, benefits and alternatives to treatment. The patient was placed supine, partially left lateral decubitus on the CT gantry and a pre procedural CT was performed re-demonstrating the known fluid collection within the right pleural space. The procedure was planned. A timeout was performed prior to the initiation of the procedure. The right posterolateral and inferior thorax was prepped and draped in the usual sterile fashion. The overlying soft tissues were anesthetized with 1% lidocaine with epinephrine. Appropriate trajectory was planned with the use of a 22 gauge spinal  needle. An 18 gauge trocar needle was advanced into the pleural space and a short Amplatz super stiff wire was coiled within the collection. Appropriate positioning was confirmed with a limited CT scan. The tract was serially dilated allowing placement of a 14 French all-purpose drainage catheter. Appropriate positioning was confirmed with a limited postprocedural CT scan. A total of approximately 60 ml of opaque yellow, purulence fluid was aspirated. Samples were sent to the lab for fluid analysis. The tube was connected to a pleurovac and sutured in place. A dressing was placed. The patient tolerated the procedure well without immediate post procedural complication. IMPRESSION: Successful CT guided placement of a 64 French all purpose drain catheter into the right empyema with aspiration of approximately 60 mL of purulence fluid. Samples were sent to the laboratory as requested by the ordering clinical team. Marliss Coots, MD [facility] Electronically Signed   By: Marliss Coots MD   On: 05/26/2021 14:44    Scheduled Meds: . enoxaparin (LOVENOX) injection  40 mg Subcutaneous Q24H  . lidocaine  1 patch Transdermal Q24H  . methylPREDNISolone (SOLU-MEDROL) injection  40 mg Intravenous Daily  . nicotine  14 mg Transdermal Daily  . traZODone  50 mg Oral QHS   Continuous Infusions: . cefTRIAXone (ROCEPHIN)  IV 2 g (05/26/21 2153)  . lactated ringers 125 mL/hr at 05/27/21 1144  . metronidazole 500 mg (05/27/21 8366)   Brief history.  55 year old female with history of hypertension and gout presents with a weeks worth of pain on her right side.  She came into the hospital yesterday and found to have an empyema.  Had chest tube placed by interventional radiology.  Assessment/Plan:  1. Clinical sepsis, present on admission with empyema underneath the right lung, tachycardia, tachypnea and leukocytosis.  I did not see any results from yesterday's  procedure so we had to send pleural fluid again today.  White blood cell count in the pleural fluid very high.  Follow-up cultures.  Empirically on Rocephin and Flagyl.  Appreciate pulmonary consultation.  Continue chest tube. 2. Pleuritic chest pain.  Add Solu-Medrol. 3. Acute cystitis with hematuria.  Added on a urine culture to yesterday's urine. 4. Diarrhea.  Discontinue senna 5. Insomnia.  Trial of trazodone. 6. Recent count.  On allopurinol 7. Essential hypertension.  Blood pressure stable off Norvasc.  Code Status:     Code Status Orders  (From admission, onward)         Start     Ordered   05/26/21 1404  Full code  Continuous        05/26/21 1405        Code Status History    This patient has a current code status but no historical code status.   Advance Care Planning Activity     Family Communication: Spoke with patient's daughter on the phone Disposition Plan: Status is: Inpatient  Dispo: The patient is from: Home              Anticipated d/c is to: Home              Patient currently can go home with a chest tube.  Treating for empyema.   Difficult to place patient.  No.  Consultants:  Pulmonary  Procedures:  Chest tube placement  Antibiotics: -Rocephin and Flagyl  Time spent: 28 minutes  [employer]

## 2021-05-27 NOTE — Plan of Care (Signed)

## 2021-05-27 NOTE — Progress Notes (Signed)
Patient and visitor aware of policy 2 visitors per day, no children under 12.  However when entering room there was a child sitting in the corner with no mask and 2 adults in the room.  Not sure if visitor works here but child did not come through medical mall.  Security advised that staff had to make first attempt to have them leave.

## 2021-05-27 NOTE — Progress Notes (Signed)
Phone call received from daughter desiring  MARS and TARS of which I do not have the ability to give to her.  Asked if patient had MyChart for her mother but she said she did not and did not want it.  She desired to speak to the doctor so that she could go over her medications and her treatment plans.  She does not want her mother to have any type of laxatives and she wants her to have an Ambien now.  Explained when Ambien was to be given.  Message sent to physician.

## 2021-05-27 NOTE — Consult Note (Signed)
Pulmonary Medicine          Date: 05/27/2021,   MRN# [id number] Tonya Johnson 11-10-1966     AdmissionWeight: 68 kg                 CurrentWeight: 68 kg   CC: Right empyema.  CC:     LLET HISTORY OF PRESENT ILLNESS   This is a 55 year old lady, a smoker, not covid vaccinated, troubled by gout, followed by chest pain, cane to us with shortness of breath, pain on deep breathing, cough x one week. No prior history of etoh abuse, aspiration or choking spells. Chest ct showed a right air fluid level c/w empyema. Interventional radiologist placed a tube. Sixty cc of pus. She is on flagyl and rocephin. Having pain at chest tube site. Discussed the findings and course of action including per response thorascopy debridement.      PAST MEDICAL HISTORY   Past Medical History:  Diagnosis Date  . Hypertension      SURGICAL HISTORY   History reviewed. No pertinent surgical history.   FAMILY HISTORY   Family History  Problem Relation Age of Onset  . Hypertension Mother   . Prostate cancer Father      SOCIAL HISTORY   Social History   Tobacco Use  . Smoking status: Current Every Day Smoker    Packs/day: 0.50    Types: Cigarettes  . Smokeless tobacco: Never Used     MEDICATIONS    Home Medication:    Current Medication:  Current Facility-Administered Medications:  .  allopurinol (ZYLOPRIM) tablet 100 mg, 100 mg, Oral, Daily, Renae GlossWieting, Richard, MD, 100 mg at 05/27/21 1509 .  cefTRIAXone (ROCEPHIN) 2 g in sodium chloride 0.9 % 100 mL IVPB, 2 g, Intravenous, Q24H, Agbata, Tochukwu, MD, Last Rate: 200 mL/hr at 05/26/21 2153, 2 g at 05/26/21 2153 .  enoxaparin (LOVENOX) injection 40 mg, 40 mg, Subcutaneous, Q24H, Agbata, Tochukwu, MD, 40 mg at 05/26/21 2152 .  ketorolac (TORADOL) 15 MG/ML injection 15 mg, 15 mg, Intravenous, Q6H PRN, Agbata, Tochukwu, MD, 15 mg at 05/26/21 2244 .  lactated ringers infusion, , Intravenous, Continuous, Agbata, Tochukwu, MD, Last  Rate: 125 mL/hr at 05/27/21 1144, New Bag at 05/27/21 1144 .  lidocaine (LIDODERM) 5 % 1 patch, 1 patch, Transdermal, Q24H, Concha SeFunke, Mary E, MD, 1 patch at 05/27/21 [phone] .  methylPREDNISolone sodium succinate (SOLU-MEDROL) 40 mg/mL injection 40 mg, 40 mg, Intravenous, Daily, Renae GlossWieting, Richard, MD, 40 mg at 05/27/21 1141 .  metroNIDAZOLE (FLAGYL) IVPB 500 mg, 500 mg, Intravenous, Q8H, Agbata, Tochukwu, MD, Last Rate: 100 mL/hr at 05/27/21 1512, 500 mg at 05/27/21 1512 .  nicotine (NICODERM CQ - dosed in mg/24 hours) patch 14 mg, 14 mg, Transdermal, Daily, Agbata, Tochukwu, MD, 14 mg at 05/27/21 0946 .  ondansetron (ZOFRAN) tablet 4 mg, 4 mg, Oral, Q6H PRN **OR** ondansetron (ZOFRAN) injection 4 mg, 4 mg, Intravenous, Q6H PRN, Agbata, Tochukwu, MD .  traZODone (DESYREL) tablet 50 mg, 50 mg, Oral, QHS, Wieting, Richard, MD    ALLERGIES   Patient has no known allergies.     REVIEW OF SYSTEMS    Review of Systems:  Gen:  Denies  fever, sweats, chills weigh loss  HEENT: Denies blurred vision, double vision, ear pain, eye pain, hearing loss, nose bleeds, sore throat Cardiac:  No dizziness, chest pain or heaviness, chest tightness,edema Resp:   + +cough or sputum porduction, + shortness of breath,+ wheezing, - hemoptysis,  Gi: Denies swallowing difficulty, stomach pain, nausea or vomiting, diarrhea, constipation, bowel incontinence Gu:  Denies bladder incontinence, burning urine Ext:   Denies Joint pain, stiffness or swelling Skin: Denies  skin rash, easy bruising or bleeding or hives Endoc:  Denies polyuria, polydipsia , polyphagia or weight change Psych:   Denies depression, insomnia or hallucinations   Other:  All other systems negative   VS: BP 136/83 (BP Location: Right Arm)   Pulse 85   Temp 98.7 F (37.1 C) (Oral)   Resp (!) 24   Ht 5\' 1"  (1.549 m)   Wt 68 kg   SpO2 100%   BMI 28.34 kg/m      PHYSICAL EXAM    GENERAL: family was in the room , she was stiing up in bed,  minimum drainage from the left chest tube HEAD: Normocephalic, atraumatic.  EYES: Pupils equal, round, reactive to light. Extraocular muscles intact. No scleral icterus.  MOUTH: Moist mucosal membrane. Dentition intact. No abscess noted.  EAR, NOSE, THROAT: Clear without exudates. No external lesions.  NECK: Supple. No thyromegaly. No nodules. No JVD.  PULMONARY: Diffuse coarse rhonchi right sided, left dullness CARDIOVASCULAR: S1 and S2. Regular rate and rhythm. No murmurs, rubs, or gallops. No edema. Pedal pulses 2+ bilaterally.  GASTROINTESTINAL: Soft, nontender, nondistended. No masses. Positive bowel sounds. No hepatosplenomegaly.  MUSCULOSKELETAL: No swelling, clubbing, or edema. Range of motion full in all extremities.  NEUROLOGIC: Cranial nerves II through XII are intact. No gross focal neurological deficits. Sensation intact. Reflexes intact.  SKIN: No ulceration, lesions, rashes, or cyanosis. Skin warm and dry. Turgor intact.  PSYCHIATRIC: Mood, affect within normal limits. The patient is awake, alert and oriented x 3. Insight, judgment intact.       IMAGING  Results for SINAI, ILLINGWORTH (MRN Tonya Plate) as of 05/27/2021 17:43  Ref. Range 05/26/2021 11:24 05/27/2021 11:24  Albumin, Fluid Latest Units: g/dL  1.8  Fluid Type-FALB Unknown  PLEURAL R  Glucose, Fluid Latest Units: mg/dL  05/29/2021  Fluid Type-FGLU Unknown  PLEUAL R  Fluid Type-FLDH Unknown PLEURAL R   LD, Fluid Latest Ref Range: 3 - 23 U/L >10,000 (H)   Fluid Type-FCT Unknown  PLEURAL  Color, Fluid Latest Ref Range: YELLOW   YELLOW (A)  Total Nucleated Cell Count, Fluid Latest Units: cu mm  62,890  Lymphs, Fluid Latest Units: %  2  Eos, Fluid Latest Units: %  0  Appearance, Fluid Latest Ref Range: CLEAR   TURBID (A)  Neutrophil Count, Fluid Latest Units: %  97  Monocyte-Macrophage-Serous Fluid Latest Units: %  1    DG Chest 2 View  Result Date: 05/26/2021 CLINICAL DATA:  Shortness of breath for 1 week EXAM: CHEST - 2  VIEW COMPARISON:  None. FINDINGS: Sizable right pleural effusion with air-fluid level laterally. The underlying lung is obscured. Clear left lung. Normal heart size. IMPRESSION: Large right pleural effusion with air-fluid level, question symptoms of empyema. Recommend chest CT. Electronically Signed   By: 05/28/2021 M.D.   On: 05/26/2021 10:11   CT Angio Chest PE W and/or Wo Contrast  Result Date: 05/26/2021 CLINICAL DATA:  Short of breath. Patient with right flank pain for 1 week. Current chest radiograph demonstrated right mid to lower lung zone opacity with a right mid lung air-fluid level. EXAM: CT ANGIOGRAPHY CHEST WITH CONTRAST TECHNIQUE: Multidetector CT imaging of the chest was performed using the standard protocol during bolus administration of intravenous contrast. Multiplanar CT image reconstructions and MIPs were  obtained to evaluate the vascular anatomy. CONTRAST:  30mL OMNIPAQUE IOHEXOL 350 MG/ML SOLN COMPARISON:  Current chest radiograph. FINDINGS: Cardiovascular: Pulmonary arteries are well opacified. There is no evidence of a pulmonary embolism. Heart is normal in size and configuration. Trace amount of pericardial fluid. Great vessels are normal in caliber. No aortic dissection or significant atherosclerosis. Arch branch vessels are widely patent. Mediastinum/Nodes: 1.3 cm posterior right thyroid nodule. No follow-up recommended. No neck base or axillary masses or enlarged lymph nodes. No mediastinal or left hilar masses or enlarged lymph nodes. Generalized increased soft tissue along the right hilum suggests mild confluent adenopathy. Trachea and esophagus are unremarkable. Lungs/Pleura: Loculated right pleural effusion in the mid to lower right hemithorax. The lateral portion of this contains an air-fluid level corresponding to the air-fluid level noted on the current chest radiograph. The overlying visceral pleura appears mildly thickened and irregular. Most of the right lower lobe is  collapsed/atelectatic. There is milder compressive atelectasis in the right middle lobe and a portion of the lateral right upper lobe adjacent to the pleural effusion. Remainder of the right lung is clear. Left lung is clear. No left pleural effusion. No pneumothorax. Bronchi appear patent.  No evidence of a centrally obstructing mass. Upper Abdomen: No acute findings. 1.6 cm left adrenal nodule, nonspecific. Musculoskeletal: No fracture or acute finding.  No bone lesion. Review of the MIP images confirms the above findings. IMPRESSION: 1. No evidence of a pulmonary embolism. 2. Loculated right pleural effusion containing an air-fluid level. Findings are suspicious for an empyema. There is associated atelectasis predominantly of the right lower lobe, to a lesser degree of the right middle lobe and minimally of the right upper lobe. Electronically Signed   By: Amie Portland M.D.   On: 05/26/2021 10:59   CT IMAGE GUIDED DRAINAGE BY PERCUTANEOUS CATHETER  Result Date: 05/26/2021 INDICATION: 55 year old female with large right pleural effusion, suspicious for empyema. EXAM: CT IMAGE GUIDED DRAINAGE BY PERCUTANEOUS CATHETER COMPARISON:  CT chest from earlier the same day MEDICATIONS: The patient is currently admitted to the hospital and receiving intravenous antibiotics. The antibiotics were administered within an appropriate time frame prior to the initiation of the procedure. ANESTHESIA/SEDATION: Moderate (conscious) sedation was employed during this procedure. A total of Versed 1 mg and Fentanyl 50 mcg was administered intravenously. Moderate Sedation Time: 15 minutes. The patient's level of consciousness and vital signs were monitored continuously by radiology nursing throughout the procedure under my direct supervision. CONTRAST:  None COMPLICATIONS: None immediate. PROCEDURE: Informed written consent was obtained from the patient after a discussion of the risks, benefits and alternatives to treatment. The  patient was placed supine, partially left lateral decubitus on the CT gantry and a pre procedural CT was performed re-demonstrating the known fluid collection within the right pleural space. The procedure was planned. A timeout was performed prior to the initiation of the procedure. The right posterolateral and inferior thorax was prepped and draped in the usual sterile fashion. The overlying soft tissues were anesthetized with 1% lidocaine with epinephrine. Appropriate trajectory was planned with the use of a 22 gauge spinal needle. An 18 gauge trocar needle was advanced into the pleural space and a short Amplatz super stiff wire was coiled within the collection. Appropriate positioning was confirmed with a limited CT scan. The tract was serially dilated allowing placement of a 14 French all-purpose drainage catheter. Appropriate positioning was confirmed with a limited postprocedural CT scan. A total of approximately 60 ml of opaque yellow,  purulence fluid was aspirated. Samples were sent to the lab for fluid analysis. The tube was connected to a pleurovac and sutured in place. A dressing was placed. The patient tolerated the procedure well without immediate post procedural complication. IMPRESSION: Successful CT guided placement of a 56 French all purpose drain catheter into the right empyema with aspiration of approximately 60 mL of purulence fluid. Samples were sent to the laboratory as requested by the ordering clinical team. Marliss Coots, MD [facility] Electronically Signed   By: Marliss Coots MD   On: 05/26/2021 14:44           ASSESSMENT/PLAN   Her presentation is c/w with empyema ( low glucose, super hig ldh, high wbc and pus looking), drain in place. On anearobic coverage.   -follow up on cultures ( pl effusion) -continue antibiotics - cxr in am -per response may need thoroscopic debridement  Smoker suspect copd -duo neb q 6  hrs prn -nicoderm patches  42 min visit       Thank you for allowing me to participate in the care of this patient.   Patient/Family are satisfied with care plan and all questions have been answered.  This document was prepared using Dragon voice recognition software and may include unintentional dictation errors.     Ned Clines, M.D.  Division of Pulmonary & Critical Care Medicine  [facility]

## 2021-05-28 ENCOUNTER — Inpatient Hospital Stay: Payer: Self-pay

## 2021-05-28 LAB — CBC WITH DIFFERENTIAL/PLATELET
Abs Immature Granulocytes: 0.69 10*3/uL — ABNORMAL HIGH (ref 0.00–0.07)
Basophils Absolute: 0.1 10*3/uL (ref 0.0–0.1)
Basophils Relative: 0 %
Eosinophils Absolute: 0 10*3/uL (ref 0.0–0.5)
Eosinophils Relative: 0 %
HCT: 32.8 % — ABNORMAL LOW (ref 36.0–46.0)
Hemoglobin: 11.1 g/dL — ABNORMAL LOW (ref 12.0–15.0)
Immature Granulocytes: 3 %
Lymphocytes Relative: 8 %
Lymphs Abs: 1.6 10*3/uL (ref 0.7–4.0)
MCH: 33 pg (ref 26.0–34.0)
MCHC: 33.8 g/dL (ref 30.0–36.0)
MCV: 97.6 fL (ref 80.0–100.0)
Monocytes Absolute: 1 10*3/uL (ref 0.1–1.0)
Monocytes Relative: 5 %
Neutro Abs: 17.8 10*3/uL — ABNORMAL HIGH (ref 1.7–7.7)
Neutrophils Relative %: 84 %
Platelets: 398 10*3/uL (ref 150–400)
RBC: 3.36 MIL/uL — ABNORMAL LOW (ref 3.87–5.11)
RDW: 13 % (ref 11.5–15.5)
WBC: 21.1 10*3/uL — ABNORMAL HIGH (ref 4.0–10.5)
nRBC: 0 % (ref 0.0–0.2)

## 2021-05-28 LAB — BASIC METABOLIC PANEL
Anion gap: 10 (ref 5–15)
BUN: 9 mg/dL (ref 6–20)
CO2: 30 mmol/L (ref 22–32)
Calcium: 8.8 mg/dL — ABNORMAL LOW (ref 8.9–10.3)
Chloride: 99 mmol/L (ref 98–111)
Creatinine, Ser: 0.48 mg/dL (ref 0.44–1.00)
GFR, Estimated: >60 mL/min (ref >60)
Glucose, Bld: 124 mg/dL — ABNORMAL HIGH (ref 70–99)
Potassium: 2.8 mmol/L — ABNORMAL LOW (ref 3.5–5.1)
Sodium: 139 mmol/L (ref 135–145)

## 2021-05-28 LAB — URINE CULTURE
Culture: NO GROWTH
Special Requests: NORMAL

## 2021-05-28 LAB — LD, BODY FLUID (OTHER): LD, Body Fluid: >18000 IU/L

## 2021-05-28 LAB — PROTEIN, BODY FLUID (OTHER): Total Protein, Body Fluid Other: 3.7 g/dL

## 2021-05-28 MED ORDER — METHOCARBAMOL 500 MG PO TABS: 500.0000 mg | ORAL_TABLET | Freq: Four times a day (QID) | ORAL | Status: DC | PRN

## 2021-05-28 MED ORDER — POTASSIUM CHLORIDE CRYS ER 20 MEQ PO TBCR
40.0000 meq | EXTENDED_RELEASE_TABLET | Freq: Once | ORAL | Status: AC
  Administered 2021-05-28: 40 meq via ORAL
  Filled 2021-05-28: qty 2

## 2021-05-28 MED ORDER — POTASSIUM CHLORIDE 10 MEQ/100ML IV SOLN
10.0000 meq | INTRAVENOUS | Status: AC
  Administered 2021-05-28 (×3): 10 meq via INTRAVENOUS
  Filled 2021-05-28 (×3): qty 100

## 2021-05-28 MED ORDER — OXYCODONE HCL 5 MG PO TABS
5.0000 mg | ORAL_TABLET | ORAL | Status: DC | PRN
  Administered 2021-05-29 – 2021-06-03 (×20): 5 mg via ORAL
  Filled 2021-05-28 (×20): qty 1

## 2021-05-28 MED ORDER — POTASSIUM CHLORIDE 10 MEQ/100ML IV SOLN
10.0000 meq | Freq: Once | INTRAVENOUS | Status: AC
  Administered 2021-05-28: 10 meq via INTRAVENOUS
  Filled 2021-05-28: qty 100

## 2021-05-28 NOTE — Progress Notes (Signed)
PROGRESS NOTE    Tonya Johnson  WUJ:[id number] DOB: 08/15/1966 DOA: 05/26/2021 PCP: Lynnell Jude, MD  Brief Narrative:  55 year old female with history of hypertension and gout presents with a weeks worth of pain on her right side.  Presented to the hospital 5/30 and found to have an empyema.  Had chest tube placed by interventional radiology.  Pulmonology consulted and following for chest tube management. 6/1: Patient remains hemodynamically stable.  Small amount of purulent fluid, 60 cc liberated upon placement of tube.  Follow-up chest x-ray shows slightly better size of effusion.   Assessment & Plan:   Principal Problem:   Empyema, right (HCC) Active Problems:   Acute lower UTI   Essential hypertension   Gout   Nicotine dependence   Hypokalemia   Sepsis ([place])   Pleuritic chest pain   Acute cystitis with hematuria   Diarrhea   Insomnia  Right side empyema Sepsis secondary to above Acute hypoxic respiratory failure secondary to above Sepsis diagnoses met with tachycardia, tachypnea, leukocytosis Chest tube to remain in place Plan: Continue chest tube Continue Rocephin and Flagyl Pulmonary follow-up for chest tube management Follow fluid study culture and sensitivities, no growth to date Daily labs Vitals per unit protocol Follow fever curve Continue supplemental oxygen via nasal cannula, wean as tolerated  Pleuritic type chest pain Appears improved after addition of Solu-Medrol, will continue  Acute cystitis associated with hematuria On antibiotics for empyema Follow urine culture  Acute diarrhea Resolved after discontinuation of senna  Insomnia Nightly trazodone  History of gout Continue allopurinol  History of hypertension Adequate control over interval Home Norvasc on hold    DVT prophylaxis: SQ Lovenox Code Status: Full code Family Communication: Family member at bedside 6/1 Disposition Plan: Status is: Inpatient  Remains inpatient  appropriate because:Inpatient level of care appropriate due to severity of illness   Dispo: The patient is from: Home              Anticipated d/c is to: Home              Patient currently is not medically stable to d/c.   Difficult to place patient No  Empyema status post chest tube placement.  Chest tube to remain in place until resolution of effusion.  If effusion does not resolve patient may warrant transfer to [facility] for thoracic surgery evaluation.     Level of care: Med-Surg  Consultants:   Pulmonology  Procedures:  Chest tube  Antimicrobials:   Ceftriaxone  Metronidazole   Subjective: Seen and examined.  Reports interval improvement in chest discomfort and pain over night.  Objective: Vitals:   05/27/21 1932 05/28/21 0451 05/28/21 0819 05/28/21 1137  BP:  (!) 129/115 131/69 126/87  Pulse:  87 93 73  Resp:   (!) 22 18  Temp:  98.2 F (36.8 C) 97.7 F (36.5 C) 97.9 F (36.6 C)  TempSrc:  Oral Oral Oral  SpO2: 97% 98% 97% 97%  Weight:      Height:        Intake/Output Summary (Last 24 hours) at 05/28/2021 1208 Last data filed at 05/28/2021 1006 Gross per 24 hour  Intake 5401.08 ml  Output 40 ml  Net 5361.08 ml   Filed Weights   05/26/21 0923  Weight: 68 kg    Examination:  General exam: Appears calm and comfortable  Respiratory system: Decreased lung sounds on right.  Normal work of breathing.  3 L Cardiovascular system: S1 & S2 heard,  RRR. No JVD, murmurs, rubs, gallops or clicks. No pedal edema. Gastrointestinal system: Abdomen is nondistended, soft and nontender. No organomegaly or masses felt. Normal bowel sounds heard. Central nervous system: Alert and oriented. No focal neurological deficits. Extremities: Symmetric 5 x 5 power. Skin: No rashes, lesions or ulcers Psychiatry: Judgement and insight appear normal. Mood & affect appropriate.     Data Reviewed: I have personally reviewed following labs and imaging studies  CBC: Recent  Labs  Lab 05/26/21 0926 05/27/21 0644 05/28/21 0552  WBC 24.2* 20.2* 21.1*  NEUTROABS 19.4*  --  17.8*  HGB 13.3 11.0* 11.1*  HCT 38.0 32.5* 32.8*  MCV 95.0 97.9 97.6  PLT 374 353 388   Basic Metabolic Panel: Recent Labs  Lab 05/26/21 0926 05/26/21 0935 05/27/21 0644 05/28/21 0552  NA 130*  --  134* 139  K 2.4*  --  3.6 2.8*  CL 85*  --  96* 99  CO2 32  --  28 30  GLUCOSE 136*  --  145* 124*  BUN 8  --  11 9  CREATININE 0.57  --  0.53 0.48  CALCIUM 9.0  --  8.8* 8.8*  MG  --  2.0  --   --    GFR: Estimated Creatinine Clearance: 70.9 mL/min (by C-G formula based on SCr of 0.48 mg/dL). Liver Function Tests: Recent Labs  Lab 05/26/21 0926  AST 18  ALT 19  ALKPHOS 132*  BILITOT 0.8  PROT 6.8  ALBUMIN 2.6*   Recent Labs  Lab 05/26/21 0926  LIPASE 21   No results for input(s): AMMONIA in the last 168 hours. Coagulation Profile: Recent Labs  Lab 05/27/21 0644  INR 1.1   Cardiac Enzymes: No results for input(s): CKTOTAL, CKMB, CKMBINDEX, TROPONINI in the last 168 hours. BNP (last 3 results) No results for input(s): PROBNP in the last 8760 hours. HbA1C: No results for input(s): HGBA1C in the last 72 hours. CBG: No results for input(s): GLUCAP in the last 168 hours. Lipid Profile: No results for input(s): CHOL, HDL, LDLCALC, TRIG, CHOLHDL, LDLDIRECT in the last 72 hours. Thyroid Function Tests: No results for input(s): TSH, T4TOTAL, FREET4, T3FREE, THYROIDAB in the last 72 hours. Anemia Panel: No results for input(s): VITAMINB12, FOLATE, FERRITIN, TIBC, IRON, RETICCTPCT in the last 72 hours. Sepsis Labs: Recent Labs  Lab 05/26/21 1000 05/26/21 1200 05/27/21 0644  PROCALCITON  --   --  0.46  LATICACIDVEN 1.8 1.9  --     Recent Results (from the past 240 hour(s))  Resp Panel by RT-PCR (Flu A&B, Covid) Nasopharyngeal Swab     Status: None   Collection Time: 05/26/21  9:40 AM   Specimen: Nasopharyngeal Swab; Nasopharyngeal(NP) swabs in vial transport  medium  Result Value Ref Range Status   SARS Coronavirus 2 by RT PCR NEGATIVE NEGATIVE Final    Comment: (NOTE) SARS-CoV-2 target nucleic acids are NOT DETECTED.  The SARS-CoV-2 RNA is generally detectable in upper respiratory specimens during the acute phase of infection. The lowest concentration of SARS-CoV-2 viral copies this assay can detect is 138 copies/mL. A negative result does not preclude SARS-Cov-2 infection and should not be used as the sole basis for treatment or other patient management decisions. A negative result may occur with  improper specimen collection/handling, submission of specimen other than nasopharyngeal swab, presence of viral mutation(s) within the areas targeted by this assay, and inadequate number of viral copies(<138 copies/mL). A negative result must be combined with clinical observations, patient history, and  epidemiological information. The expected result is Negative.  Fact Sheet for Patients:  EntrepreneurPulse.com.au  Fact Sheet for Healthcare Providers:  IncredibleEmployment.be  This test is no t yet approved or cleared by the [place] FDA and  has been authorized for detection and/or diagnosis of SARS-CoV-2 by FDA under an Emergency Use Authorization (EUA). This EUA will remain  in effect (meaning this test can be used) for the duration of the COVID-19 declaration under Section 564(b)(1) of the Act, 21 U.S.C.section 360bbb-3(b)(1), unless the authorization is terminated  or revoked sooner.       Influenza A by PCR NEGATIVE NEGATIVE Final   Influenza B by PCR NEGATIVE NEGATIVE Final    Comment: (NOTE) The Xpert Xpress SARS-CoV-2/FLU/RSV plus assay is intended as an aid in the diagnosis of influenza from Nasopharyngeal swab specimens and should not be used as a sole basis for treatment. Nasal washings and aspirates are unacceptable for Xpert Xpress SARS-CoV-2/FLU/RSV testing.  Fact Sheet for  Patients: EntrepreneurPulse.com.au  Fact Sheet for Healthcare Providers: IncredibleEmployment.be  This test is not yet approved or cleared by the [place] FDA and has been authorized for detection and/or diagnosis of SARS-CoV-2 by FDA under an Emergency Use Authorization (EUA). This EUA will remain in effect (meaning this test can be used) for the duration of the COVID-19 declaration under Section 564(b)(1) of the Act, 21 U.S.C. section 360bbb-3(b)(1), unless the authorization is terminated or revoked.  Performed at [facility], [address] [place], [place] [address]   Blood culture (routine x 2)     Status: None (Preliminary result)   Collection Time: 05/26/21 10:08 AM   Specimen: BLOOD  Result Value Ref Range Status   Specimen Description BLOOD BLOOD RIGHT FOREARM  Final   Special Requests   Final    BOTTLES DRAWN AEROBIC AND ANAEROBIC Blood Culture results may not be optimal due to an excessive volume of blood received in culture bottles   Culture   Final    NO GROWTH 2 DAYS Performed at [facility], [address], [place], [place] [address]    Report Status PENDING  Incomplete  Blood culture (routine x 2)     Status: None (Preliminary result)   Collection Time: 05/26/21 10:08 AM   Specimen: BLOOD  Result Value Ref Range Status   Specimen Description BLOOD BLOOD RIGHT ARM  Final   Special Requests   Final    BOTTLES DRAWN AEROBIC AND ANAEROBIC Blood Culture adequate volume   Culture   Final    NO GROWTH 2 DAYS Performed at [facility], [address] [place], [place] [address]    Report Status PENDING  Incomplete  Urine Culture     Status: None   Collection Time: 05/26/21 12:00 PM   Specimen: Urine, Random  Result Value Ref Range Status   Specimen Description   Final    URINE, RANDOM Performed at [facility], [address] [place], [place] [address]    Special Requests    Final    Normal Performed at [facility], [address] [place], [place] [address]    Culture   Final    NO GROWTH Performed at [facility], [place] [address] [place], [place] [address]    Report Status 05/28/2021 FINAL  Final  Aerobic/Anaerobic Culture (surgical/deep wound)     Status: None (Preliminary result)   Collection Time: 05/26/21  2:26 PM   Specimen: Abscess  Result Value Ref Range Status  Specimen Description ABSCESS RIGHT PLEURAL  Final   Special Requests SYRINGE  Final   Gram Stain   Final    ABUNDANT WBC PRESENT, PREDOMINANTLY PMN NO ORGANISMS SEEN    Culture   Final    NO GROWTH < 12 HOURS Performed at [facility] [address], [place], [place] [address]    Report Status PENDING  Incomplete  Body fluid culture w Gram Stain     Status: None (Preliminary result)   Collection Time: 05/27/21 11:24 AM   Specimen: Pleura; Body Fluid  Result Value Ref Range Status   Specimen Description   Final    PLEURAL Performed at [facility], [address] [place], [place] [address]    Special Requests   Final    PLEURAL Performed at [facility], [address] [place], [place] [address]    Gram Stain   Final    ABUNDANT WBC PRESENT,BOTH PMN AND MONONUCLEAR NO ORGANISMS SEEN    Culture   Final    NO GROWTH < 24 HOURS Performed at [facility], [place] [address], [place], [place] [address]    Report Status PENDING  Incomplete         Radiology Studies: DG Chest Port 1 View  Result Date: 05/28/2021 CLINICAL DATA:  Syncope EXAM: PORTABLE CHEST 1 VIEW COMPARISON:  Chest x-ray 05/26/2021, CT chest 05/26/2021 FINDINGS: Interval placement of right lower pigtail drainage catheter. Slight decreased loculated right pleural effusion. Mild cardiomegaly with increased vascular congestion and probable mild edema. Aortic atherosclerosis. Persistent airspace disease at the right middle lobe and right base IMPRESSION: 1.  Interval placement of right lower chest drainage catheter with slight decreased loculated right pleural collection 2. Mild cardiomegaly with interim vascular congestion and mild pulmonary edema 3. Consolidation at the right middle lobe and right base Electronically Signed   By: Donavan Foil M.D.   On: 05/28/2021 00:32   CT IMAGE GUIDED DRAINAGE BY PERCUTANEOUS CATHETER  Result Date: 05/26/2021 INDICATION: 55 year old female with large right pleural effusion, suspicious for empyema. EXAM: CT IMAGE GUIDED DRAINAGE BY PERCUTANEOUS CATHETER COMPARISON:  CT chest from earlier the same day MEDICATIONS: The patient is currently admitted to the hospital and receiving intravenous antibiotics. The antibiotics were administered within an appropriate time frame prior to the initiation of the procedure. ANESTHESIA/SEDATION: Moderate (conscious) sedation was employed during this procedure. A total of Versed 1 mg and Fentanyl 50 mcg was administered intravenously. Moderate Sedation Time: 15 minutes. The patient's level of consciousness and vital signs were monitored continuously by radiology nursing throughout the procedure under my direct supervision. CONTRAST:  None COMPLICATIONS: None immediate. PROCEDURE: Informed written consent was obtained from the patient after a discussion of the risks, benefits and alternatives to treatment. The patient was placed supine, partially left lateral decubitus on the CT gantry and a pre procedural CT was performed re-demonstrating the known fluid collection within the right pleural space. The procedure was planned. A timeout was performed prior to the initiation of the procedure. The right posterolateral and inferior thorax was prepped and draped in the usual sterile fashion. The overlying soft tissues were anesthetized with 1% lidocaine with epinephrine. Appropriate trajectory was planned with the use of a 22 gauge spinal needle. An 18 gauge trocar needle was advanced into the pleural  space and a short Amplatz super stiff wire was coiled within the collection. Appropriate positioning was confirmed with a limited CT scan. The tract was serially dilated allowing placement  of a 68 [place] all-purpose drainage catheter. Appropriate positioning was confirmed with a limited postprocedural CT scan. A total of approximately 60 ml of opaque yellow, purulence fluid was aspirated. Samples were sent to the lab for fluid analysis. The tube was connected to a pleurovac and sutured in place. A dressing was placed. The patient tolerated the procedure well without immediate post procedural complication. IMPRESSION: Successful CT guided placement of a 59 French all purpose drain catheter into the right empyema with aspiration of approximately 60 mL of purulence fluid. Samples were sent to the laboratory as requested by the ordering clinical team. Ruthann Cancer, MD [facility] Radiology Electronically Signed   By: Ruthann Cancer MD   On: 05/26/2021 14:44        Scheduled Meds: . allopurinol  100 mg Oral Daily  . enoxaparin (LOVENOX) injection  40 mg Subcutaneous Q24H  . lidocaine  1 patch Transdermal Q24H  . methylPREDNISolone (SOLU-MEDROL) injection  40 mg Intravenous Daily  . nicotine  14 mg Transdermal Daily  . traZODone  50 mg Oral QHS   Continuous Infusions: . cefTRIAXone (ROCEPHIN)  IV Stopped (05/27/21 2135)  . lactated ringers 125 mL/hr at 05/28/21 0738  . metronidazole Stopped (05/28/21 0713)  . potassium chloride 10 mEq (05/28/21 1147)     LOS: 2 days    Time spent: 25 minutes    Sidney Ace, MD [facility] Pager [phone]  If 7PM-7AM, please contact night-coverage 05/28/2021, 12:08 PM

## 2021-05-28 NOTE — Progress Notes (Signed)
Pulmonary Medicine          Date: 05/28/2021,   MRN# [id number] FLYNN LININGER 19-Jan-1966     AdmissionWeight: 68 kg                 CurrentWeight: 68 kg        HISTORY OF PRESENT ILLNESS    This is a 55 year old lady, a smoker, not covid vaccinated, troubled by gout, followed by chest pain, cane to [place] with shortness of breath, pain on deep breathing, cough x one week. No prior history of etoh abuse, aspiration or choking spells. Chest ct showed a right air fluid level c/w empyema. Interventional radiologist placed a tube. Sixty cc of pus. She is on flagyl and rocephin. Had a better night Discussed the findings and course of action including per response thorascopy debridement.  chest tube not ready to come as requested by patient.  no fever overnight.      PAST MEDICAL HISTORY   Past Medical History:  Diagnosis Date  . Hypertension      SURGICAL HISTORY   History reviewed. No pertinent surgical history.   FAMILY HISTORY   Family History  Problem Relation Age of Onset  . Hypertension Mother   . Prostate cancer Father      SOCIAL HISTORY   Social History   Tobacco Use  . Smoking status: Current Every Day Smoker    Packs/day: 0.50    Types: Cigarettes  . Smokeless tobacco: Never Used     MEDICATIONS    Home Medication:    Current Medication:  Current Facility-Administered Medications:  .  allopurinol (ZYLOPRIM) tablet 100 mg, 100 mg, Oral, Daily, Renae Gloss, Richard, MD, 100 mg at 05/27/21 1509 .  cefTRIAXone (ROCEPHIN) 2 g in sodium chloride 0.9 % 100 mL IVPB, 2 g, Intravenous, Q24H, Agbata, Tochukwu, MD, Stopped at 05/27/21 2135 .  enoxaparin (LOVENOX) injection 40 mg, 40 mg, Subcutaneous, Q24H, Agbata, Tochukwu, MD, 40 mg at 05/27/21 2105 .  ketorolac (TORADOL) 15 MG/ML injection 15 mg, 15 mg, Intravenous, Q6H PRN, Agbata, Tochukwu, MD, 15 mg at 05/26/21 2244 .  lactated ringers infusion, , Intravenous, Continuous, Agbata, Tochukwu, MD,  Last Rate: 125 mL/hr at 05/28/21 0738, Infusion Verify at 05/28/21 0738 .  lidocaine (LIDODERM) 5 % 1 patch, 1 patch, Transdermal, Q24H, Concha Se, MD, 1 patch at 05/27/21 [id number] .  methylPREDNISolone sodium succinate (SOLU-MEDROL) 40 mg/mL injection 40 mg, 40 mg, Intravenous, Daily, Renae Gloss, Richard, MD, 40 mg at 05/27/21 1141 .  metroNIDAZOLE (FLAGYL) IVPB 500 mg, 500 mg, Intravenous, Q8H, Agbata, Tochukwu, MD, Stopped at 05/28/21 0713 .  nicotine (NICODERM CQ - dosed in mg/24 hours) patch 14 mg, 14 mg, Transdermal, Daily, Agbata, Tochukwu, MD, 14 mg at 05/27/21 0946 .  ondansetron (ZOFRAN) tablet 4 mg, 4 mg, Oral, Q6H PRN **OR** ondansetron (ZOFRAN) injection 4 mg, 4 mg, Intravenous, Q6H PRN, Agbata, Tochukwu, MD .  potassium chloride 10 mEq in 100 mL IVPB, 10 mEq, Intravenous, Q1 Hr x 4, Sreenath, Sudheer B, MD, Last Rate: 100 mL/hr at 05/28/21 0809, 10 mEq at 05/28/21 0809 .  traZODone (DESYREL) tablet 50 mg, 50 mg, Oral, QHS, Wieting, Richard, MD, 50 mg at 05/27/21 2102    ALLERGIES   Patient has no known allergies.     REVIEW OF SYSTEMS    Review of Systems:  Gen:  Denies  fever, sweats, chills weigh loss  HEENT: Denies blurred vision, double vision, ear pain, eye pain,  hearing loss, nose bleeds, sore throat Cardiac:  No dizziness, chest pain or heaviness, chest tightness,edema Resp:   cough or sputum porduction, shortness of breath,- wheezing, no  hemoptysis,  Gi: Denies swallowing difficulty, stomach pain, nausea or vomiting, diarrhea, constipation, bowel incontinence Gu:  Denies bladder incontinence, burning urine Ext:   Denies Joint pain, stiffness or swelling Skin: Denies  skin rash, easy bruising or bleeding or hives Endoc:  Denies polyuria, polydipsia , polyphagia or weight change Psych:   Denies depression, insomnia or hallucinations   Other:  All other systems negative   VS: BP (!) 129/115   Pulse 87   Temp 98.2 F (36.8 C) (Oral)   Resp 17   Ht 5\' 1"   (1.549 m)   Wt 68 kg   SpO2 98%   BMI 28.34 kg/m      PHYSICAL EXAM    GENERAL:NAD, family in room HEAD: Normocephalic, atraumatic.  EYES: Pupils equal, round, reactive to light. Extraocular muscles intact. No scleral icterus.  MOUTH: Moist mucosal membrane. Dentition intact. No abscess noted.  EAR, NOSE, THROAT: Clear without exudates. No external lesions.  NECK: Supple. No thyromegaly. No nodules. No JVD.  PULMONARY: right dulness, chest tube in place CARDIOVASCULAR: S1 and S2. Regular rate and rhythm. No murmurs, rubs, or gallops. No edema. Pedal pulses 2+ bilaterally.  GASTROINTESTINAL: Soft, nontender, nondistended. No masses. Positive bowel sounds. No hepatosplenomegaly.  MUSCULOSKELETAL: No swelling, clubbing, or edema. Range of motion full in all extremities.  NEUROLOGIC: Cranial nerves II through XII are intact. No gross focal neurological deficits. Sensation intact. Reflexes intact.  SKIN: No ulceration, lesions, rashes, or cyanosis. Skin warm and dry. Turgor intact.  PSYCHIATRIC: Mood, affect within normal limits. The patient is awake, alert and oriented x 3. Insight, judgment intact.       IMAGING    DG Chest 2 View  Result Date: 05/26/2021 CLINICAL DATA:  Shortness of breath for 1 week EXAM: CHEST - 2 VIEW COMPARISON:  None. FINDINGS: Sizable right pleural effusion with air-fluid level laterally. The underlying lung is obscured. Clear left lung. Normal heart size. IMPRESSION: Large right pleural effusion with air-fluid level, question symptoms of empyema. Recommend chest CT. Electronically Signed   By: 05/28/2021 M.D.   On: 05/26/2021 10:11   CT Angio Chest PE W and/or Wo Contrast  Result Date: 05/26/2021 CLINICAL DATA:  Short of breath. Patient with right flank pain for 1 week. Current chest radiograph demonstrated right mid to lower lung zone opacity with a right mid lung air-fluid level. EXAM: CT ANGIOGRAPHY CHEST WITH CONTRAST TECHNIQUE: Multidetector CT  imaging of the chest was performed using the standard protocol during bolus administration of intravenous contrast. Multiplanar CT image reconstructions and MIPs were obtained to evaluate the vascular anatomy. CONTRAST:  42mL OMNIPAQUE IOHEXOL 350 MG/ML SOLN COMPARISON:  Current chest radiograph. FINDINGS: Cardiovascular: Pulmonary arteries are well opacified. There is no evidence of a pulmonary embolism. Heart is normal in size and configuration. Trace amount of pericardial fluid. Great vessels are normal in caliber. No aortic dissection or significant atherosclerosis. Arch branch vessels are widely patent. Mediastinum/Nodes: 1.3 cm posterior right thyroid nodule. No follow-up recommended. No neck base or axillary masses or enlarged lymph nodes. No mediastinal or left hilar masses or enlarged lymph nodes. Generalized increased soft tissue along the right hilum suggests mild confluent adenopathy. Trachea and esophagus are unremarkable. Lungs/Pleura: Loculated right pleural effusion in the mid to lower right hemithorax. The lateral portion of this contains an air-fluid level  corresponding to the air-fluid level noted on the current chest radiograph. The overlying visceral pleura appears mildly thickened and irregular. Most of the right lower lobe is collapsed/atelectatic. There is milder compressive atelectasis in the right middle lobe and a portion of the lateral right upper lobe adjacent to the pleural effusion. Remainder of the right lung is clear. Left lung is clear. No left pleural effusion. No pneumothorax. Bronchi appear patent.  No evidence of a centrally obstructing mass. Upper Abdomen: No acute findings. 1.6 cm left adrenal nodule, nonspecific. Musculoskeletal: No fracture or acute finding.  No bone lesion. Review of the MIP images confirms the above findings. IMPRESSION: 1. No evidence of a pulmonary embolism. 2. Loculated right pleural effusion containing an air-fluid level. Findings are suspicious for an  empyema. There is associated atelectasis predominantly of the right lower lobe, to a lesser degree of the right middle lobe and minimally of the right upper lobe. Electronically Signed   By: Amie Portlandavid  Ormond M.D.   On: 05/26/2021 10:59   DG Chest Port 1 View  Result Date: 05/28/2021 CLINICAL DATA:  Syncope EXAM: PORTABLE CHEST 1 VIEW COMPARISON:  Chest x-ray 05/26/2021, CT chest 05/26/2021 FINDINGS: Interval placement of right lower pigtail drainage catheter. Slight decreased loculated right pleural effusion. Mild cardiomegaly with increased vascular congestion and probable mild edema. Aortic atherosclerosis. Persistent airspace disease at the right middle lobe and right base IMPRESSION: 1. Interval placement of right lower chest drainage catheter with slight decreased loculated right pleural collection 2. Mild cardiomegaly with interim vascular congestion and mild pulmonary edema 3. Consolidation at the right middle lobe and right base Electronically Signed   By: Jasmine PangKim  Fujinaga M.D.   On: 05/28/2021 00:32   CT IMAGE GUIDED DRAINAGE BY PERCUTANEOUS CATHETER  Result Date: 05/26/2021 INDICATION: 55 year old female with large right pleural effusion, suspicious for empyema. EXAM: CT IMAGE GUIDED DRAINAGE BY PERCUTANEOUS CATHETER COMPARISON:  CT chest from earlier the same day MEDICATIONS: The patient is currently admitted to the hospital and receiving intravenous antibiotics. The antibiotics were administered within an appropriate time frame prior to the initiation of the procedure. ANESTHESIA/SEDATION: Moderate (conscious) sedation was employed during this procedure. A total of Versed 1 mg and Fentanyl 50 mcg was administered intravenously. Moderate Sedation Time: 15 minutes. The patient's level of consciousness and vital signs were monitored continuously by radiology nursing throughout the procedure under my direct supervision. CONTRAST:  None COMPLICATIONS: None immediate. PROCEDURE: Informed written consent was  obtained from the patient after a discussion of the risks, benefits and alternatives to treatment. The patient was placed supine, partially left lateral decubitus on the CT gantry and a pre procedural CT was performed re-demonstrating the known fluid collection within the right pleural space. The procedure was planned. A timeout was performed prior to the initiation of the procedure. The right posterolateral and inferior thorax was prepped and draped in the usual sterile fashion. The overlying soft tissues were anesthetized with 1% lidocaine with epinephrine. Appropriate trajectory was planned with the use of a 22 gauge spinal needle. An 18 gauge trocar needle was advanced into the pleural space and a short Amplatz super stiff wire was coiled within the collection. Appropriate positioning was confirmed with a limited CT scan. The tract was serially dilated allowing placement of a 14 French all-purpose drainage catheter. Appropriate positioning was confirmed with a limited postprocedural CT scan. A total of approximately 60 ml of opaque yellow, purulence fluid was aspirated. Samples were sent to the lab for fluid analysis. The  tube was connected to a pleurovac and sutured in place. A dressing was placed. The patient tolerated the procedure well without immediate post procedural complication. IMPRESSION: Successful CT guided placement of a 52 French all purpose drain catheter into the right empyema with aspiration of approximately 60 mL of purulence fluid. Samples were sent to the laboratory as requested by the ordering clinical team. Marliss Coots, MD [facility] Electronically Signed   By: Marliss Coots MD   On: 05/26/2021 14:44   Cultures negative so far   ASSESSMENT/PLAN   Her presentation is c/w with empyema ( low glucose, super hig ldh, high wbc and pus looking), drain in place. On anearobic coverage. Plural effusion down a little. Persistent  consolidation, no obvious central lesion. Suspect pneumonia  -follow up on cultures ( pl effusion) -continue antibiotics -per response may need thoroscopic debridement  Smoker suspect copd -duo neb q 6 hrs prn -nicoderm patches      Thank you for allowing me to participate in the care of this patient.   Patient/Family are satisfied with care plan and all questions have been answered.  This document was prepared using Dragon voice recognition software and may include unintentional dictation errors.     Ned Clines, M.D.  Division of Pulmonary & Critical Care Medicine  [facility]

## 2021-05-28 NOTE — Progress Notes (Signed)
Mobility Specialist - Progress Note   05/28/21 1500  Mobility  Activity Ambulated in hall  Level of Assistance Standby assist, set-up cues, supervision of patient - no hands on  Assistive Device None (IV Pole)  Distance Ambulated (ft) 190 ft  Mobility Ambulated with assistance in hallway  Mobility Response Tolerated well  Mobility performed by Mobility specialist  $Mobility charge 1 Mobility    Pre-mobility: 79 HR, 94% SpO2 During mobility: 86 HR, 90% SpO2 Post-mobility: 76 HR, 100% SpO2   RN entered to assist with chest tube. Pt ambulated in hallway with supervision, initially using only 2L but bumped to 4L during ambulation. No LOB pushing IV pole. Denied SOB throughout session, O2 inconsistent on various devices but appears to have desat to a low of 85% while on 4L. Denies fatigue after activity, RPE 1/10. Pt back on 2L prior to exit.    Filiberto Pinks Mobility Specialist 05/28/21, 3:47 PM

## 2021-05-29 LAB — CBC WITH DIFFERENTIAL/PLATELET
Abs Immature Granulocytes: 0.74 10*3/uL — ABNORMAL HIGH (ref 0.00–0.07)
Basophils Absolute: 0.1 10*3/uL (ref 0.0–0.1)
Basophils Relative: 1 %
Eosinophils Absolute: 0 10*3/uL (ref 0.0–0.5)
Eosinophils Relative: 0 %
HCT: 35.5 % — ABNORMAL LOW (ref 36.0–46.0)
Hemoglobin: 12 g/dL (ref 12.0–15.0)
Immature Granulocytes: 4 %
Lymphocytes Relative: 12 %
Lymphs Abs: 2.3 10*3/uL (ref 0.7–4.0)
MCH: 33.3 pg (ref 26.0–34.0)
MCHC: 33.8 g/dL (ref 30.0–36.0)
MCV: 98.6 fL (ref 80.0–100.0)
Monocytes Absolute: 1.1 10*3/uL — ABNORMAL HIGH (ref 0.1–1.0)
Monocytes Relative: 6 %
Neutro Abs: 15.2 10*3/uL — ABNORMAL HIGH (ref 1.7–7.7)
Neutrophils Relative %: 77 %
Platelets: 489 10*3/uL — ABNORMAL HIGH (ref 150–400)
RBC: 3.6 MIL/uL — ABNORMAL LOW (ref 3.87–5.11)
RDW: 13 % (ref 11.5–15.5)
WBC: 19.4 10*3/uL — ABNORMAL HIGH (ref 4.0–10.5)
nRBC: 0 % (ref 0.0–0.2)

## 2021-05-29 LAB — PH, BODY FLUID: pH, Body Fluid: 6.3

## 2021-05-29 LAB — BASIC METABOLIC PANEL
Anion gap: 10 (ref 5–15)
BUN: 14 mg/dL (ref 6–20)
CO2: 27 mmol/L (ref 22–32)
Calcium: 8.9 mg/dL (ref 8.9–10.3)
Chloride: 101 mmol/L (ref 98–111)
Creatinine, Ser: 0.5 mg/dL (ref 0.44–1.00)
GFR, Estimated: >60 mL/min (ref >60)
Glucose, Bld: 110 mg/dL — ABNORMAL HIGH (ref 70–99)
Potassium: 3.1 mmol/L — ABNORMAL LOW (ref 3.5–5.1)
Sodium: 138 mmol/L (ref 135–145)

## 2021-05-29 LAB — CYTOLOGY - NON PAP

## 2021-05-29 MED ORDER — SODIUM CHLORIDE 0.9 % IV SOLN
3.0000 g | Freq: Four times a day (QID) | INTRAVENOUS | Status: DC
  Administered 2021-05-29 – 2021-06-03 (×18): 3 g via INTRAVENOUS
  Filled 2021-05-29 (×2): qty 3
  Filled 2021-05-29 (×3): qty 8
  Filled 2021-05-29: qty 3
  Filled 2021-05-29 (×3): qty 8
  Filled 2021-05-29: qty 3
  Filled 2021-05-29 (×3): qty 8
  Filled 2021-05-29 (×2): qty 3
  Filled 2021-05-29: qty 8
  Filled 2021-05-29: qty 3
  Filled 2021-05-29 (×3): qty 8
  Filled 2021-05-29: qty 3
  Filled 2021-05-29 (×4): qty 8
  Filled 2021-05-29: qty 3

## 2021-05-29 MED ORDER — KCL-LACTATED RINGERS 20 MEQ/L IV SOLN
INTRAVENOUS | Status: DC
  Filled 2021-05-29 (×2): qty 1000

## 2021-05-29 MED ORDER — POTASSIUM CHLORIDE CRYS ER 20 MEQ PO TBCR
40.0000 meq | EXTENDED_RELEASE_TABLET | Freq: Once | ORAL | Status: AC
  Administered 2021-05-29: 40 meq via ORAL
  Filled 2021-05-29: qty 2

## 2021-05-29 NOTE — Progress Notes (Signed)
Mobility Specialist - Progress Note   05/29/21 1622  Mobility  Activity Ambulated in hall  Level of Assistance Independent after set-up  Assistive Device None (IV Pole)  Distance Ambulated (ft) 500 ft  Mobility Ambulated with assistance in hallway  Mobility Response Tolerated well  Mobility performed by Mobility specialist  $Mobility charge 1 Mobility    Pre-mobility: 76 HR, 96% SpO2 During mobility: 82 HR, 94% SpO2 Post-mobility: 79 HR, 93% SpO2   Pt sitting in recliner upon arrival, utilizing room air. Pt independent after set-up, assistance only with line/tube management. Pt ambulated in hallway, pushing IV pole. No LOB. Denied pain. Denied SOB throughout activity. O2 > 92% during session. States "today has been a good one, I'm feeling pretty good." RPE 2/10. Pt left in recliner. RN notified of performance.     Tonya Johnson Mobility Specialist 05/29/21, 4:27 PM

## 2021-05-29 NOTE — Plan of Care (Signed)

## 2021-05-29 NOTE — Progress Notes (Signed)
PROGRESS NOTE    Tonya Johnson  NCH:[id number] DOB: April 03, 1966 DOA: 05/26/2021 PCP: Lynnell Jude, MD  Brief Narrative:  55 year old female with history of hypertension and gout presents with a weeks worth of pain on her right side.  Presented to the hospital 5/30 and found to have an empyema.  Had chest tube placed by interventional radiology.  Pulmonology consulted and following for chest tube management. 6/1: Patient remains hemodynamically stable.  Small amount of purulent fluid, 60 cc liberated upon placement of tube.  Follow-up chest x-ray shows slightly better size of effusion.   Assessment & Plan:   Principal Problem:   Empyema, right ([place]) Active Problems:   Acute lower UTI   Essential hypertension   Gout   Nicotine dependence   Hypokalemia   Sepsis ([place])   Pleuritic chest pain   Acute cystitis with hematuria   Diarrhea   Insomnia  Right side empyema Sepsis secondary to above Acute hypoxic respiratory failure secondary to above Sepsis diagnoses met with tachycardia, tachypnea, leukocytosis Chest tube to remain in place 6/2:  Plan: Continue chest tube Continue Rocephin and Flagyl Pulmonary follow-up for chest tube management Follow fluid study culture and sensitivities, no growth to date Daily labs Vitals per unit protocol Follow fever curve Patient has been stable on room air with only slight desaturation 88% at night.  Oxygen as necessary Repeat chest x-ray in a.m.  Pleuritic type chest pain Appears improved after addition of Solu-Medrol, will continue  Acute cystitis associated with hematuria On antibiotics for empyema Follow urine culture, no growth  Acute diarrhea Resolved after discontinuation of senna  Insomnia Nightly trazodone  History of gout Continue allopurinol  History of hypertension Adequate control over interval Home Norvasc on hold    DVT prophylaxis: SQ Lovenox Code Status: Full code Family Communication: Family member at  bedside 6/1, 6/2 Disposition Plan: Status is: Inpatient  Remains inpatient appropriate because:Inpatient level of care appropriate due to severity of illness   Dispo: The patient is from: Home              Anticipated d/c is to: Home              Patient currently is not medically stable to d/c.   Difficult to place patient No  Empyema.  Chest tube in place.  Chest tube to remain in place.  Repeat chest x-ray in a.m. to assess size and severity of effusion.     Level of care: Med-Surg  Consultants:   Pulmonology  Procedures:  Chest tube  Antimicrobials:   Ceftriaxone  Metronidazole   Subjective: Seen and examined.  Reports interval improvement in chest discomfort and pain over night.  Objective: Vitals:   05/29/21 0419 05/29/21 0747 05/29/21 1106 05/29/21 1116  BP: 136/83 (!) 146/81 134/85 (!) 148/81  Pulse: 76 97 68 70  Resp: _0 Temp: 98 F (36.7 C) 98 F (36.7 C) 97.8 F (36.6 C) (!) 97.4 F (36.3 C)  TempSrc: Oral Oral Oral Oral  SpO2: 96% 97% 95% 96%  Weight:      Height:        Intake/Output Summary (Last 24 hours) at 05/29/2021 1253 Last data filed at 05/29/2021 1014 Gross per 24 hour  Intake 3286.41 ml  Output 151 ml  Net 3135.41 ml   Filed Weights   05/26/21 0923  Weight: 68 kg    Examination:  General exam: Appears calm and comfortable  Respiratory system: Decreased lung sounds on  right.  Normal work of breathing.  3 L Cardiovascular system: S1 & S2 heard, RRR. No JVD, murmurs, rubs, gallops or clicks. No pedal edema. Gastrointestinal system: Abdomen is nondistended, soft and nontender. No organomegaly or masses felt. Normal bowel sounds heard. Central nervous system: Alert and oriented. No focal neurological deficits. Extremities: Symmetric 5 x 5 power. Skin: No rashes, lesions or ulcers Psychiatry: Judgement and insight appear normal. Mood & affect appropriate.     Data Reviewed: I have personally reviewed following labs and  imaging studies  CBC: Recent Labs  Lab 05/26/21 0926 05/27/21 0644 05/28/21 0552 05/29/21 0728  WBC 24.2* 20.2* 21.1* 19.4*  NEUTROABS 19.4*  --  17.8* 15.2*  HGB 13.3 11.0* 11.1* 12.0  HCT 38.0 32.5* 32.8* 35.5*  MCV 95.0 97.9 97.6 98.6  PLT 374 353 398 090*   Basic Metabolic Panel: Recent Labs  Lab 05/26/21 0926 05/26/21 0935 05/27/21 0644 05/28/21 0552 05/29/21 0728  NA 130*  --  134* 139 138  K 2.4*  --  3.6 2.8* 3.1*  CL 85*  --  96* 99 101  CO2 32  --  _0 GLUCOSE 136*  --  145* 124* 110*  BUN 8  --  _1 CREATININE 0.57  --  0.53 0.48 0.50  CALCIUM 9.0  --  8.8* 8.8* 8.9  MG  --  2.0  --   --   --    GFR: Estimated Creatinine Clearance: 70.9 mL/min (by C-G formula based on SCr of 0.5 mg/dL). Liver Function Tests: Recent Labs  Lab 05/26/21 0926  AST 18  ALT 19  ALKPHOS 132*  BILITOT 0.8  PROT 6.8  ALBUMIN 2.6*   Recent Labs  Lab 05/26/21 0926  LIPASE 21   No results for input(s): AMMONIA in the last 168 hours. Coagulation Profile: Recent Labs  Lab 05/27/21 0644  INR 1.1   Cardiac Enzymes: No results for input(s): CKTOTAL, CKMB, CKMBINDEX, TROPONINI in the last 168 hours. BNP (last 3 results) No results for input(s): PROBNP in the last 8760 hours. HbA1C: No results for input(s): HGBA1C in the last 72 hours. CBG: No results for input(s): GLUCAP in the last 168 hours. Lipid Profile: No results for input(s): CHOL, HDL, LDLCALC, TRIG, CHOLHDL, LDLDIRECT in the last 72 hours. Thyroid Function Tests: No results for input(s): TSH, T4TOTAL, FREET4, T3FREE, THYROIDAB in the last 72 hours. Anemia Panel: No results for input(s): VITAMINB12, FOLATE, FERRITIN, TIBC, IRON, RETICCTPCT in the last 72 hours. Sepsis Labs: Recent Labs  Lab 05/26/21 1000 05/26/21 1200 05/27/21 0644  PROCALCITON  --   --  0.46  LATICACIDVEN 1.8 1.9  --     Recent Results (from the past 240 hour(s))  Resp Panel by RT-PCR (Flu A&B, Covid) Nasopharyngeal Swab      Status: None   Collection Time: 05/26/21  9:40 AM   Specimen: Nasopharyngeal Swab; Nasopharyngeal(NP) swabs in vial transport medium  Result Value Ref Range Status   SARS Coronavirus 2 by RT PCR NEGATIVE NEGATIVE Final    Comment: (NOTE) SARS-CoV-2 target nucleic acids are NOT DETECTED.  The SARS-CoV-2 RNA is generally detectable in upper respiratory specimens during the acute phase of infection. The lowest concentration of SARS-CoV-2 viral copies this assay can detect is 138 copies/mL. A negative result does not preclude SARS-Cov-2 infection and should not be used as the sole basis for treatment or other patient management decisions. A negative result may occur with  improper specimen collection/handling, submission  of specimen other than nasopharyngeal swab, presence of viral mutation(s) within the areas targeted by this assay, and inadequate number of viral copies(<138 copies/mL). A negative result must be combined with clinical observations, patient history, and epidemiological information. The expected result is Negative.  Fact Sheet for Patients:  EntrepreneurPulse.com.au  Fact Sheet for Healthcare Providers:  IncredibleEmployment.be  This test is no t yet approved or cleared by the [place] FDA and  has been authorized for detection and/or diagnosis of SARS-CoV-2 by FDA under an Emergency Use Authorization (EUA). This EUA will remain  in effect (meaning this test can be used) for the duration of the COVID-19 declaration under Section 564(b)(1) of the Act, 21 U.S.C.section 360bbb-3(b)(1), unless the authorization is terminated  or revoked sooner.       Influenza A by PCR NEGATIVE NEGATIVE Final   Influenza B by PCR NEGATIVE NEGATIVE Final    Comment: (NOTE) The Xpert Xpress SARS-CoV-2/FLU/RSV plus assay is intended as an aid in the diagnosis of influenza from Nasopharyngeal swab specimens and should not be used as a sole  basis for treatment. Nasal washings and aspirates are unacceptable for Xpert Xpress SARS-CoV-2/FLU/RSV testing.  Fact Sheet for Patients: EntrepreneurPulse.com.au  Fact Sheet for Healthcare Providers: IncredibleEmployment.be  This test is not yet approved or cleared by the [place] FDA and has been authorized for detection and/or diagnosis of SARS-CoV-2 by FDA under an Emergency Use Authorization (EUA). This EUA will remain in effect (meaning this test can be used) for the duration of the COVID-19 declaration under Section 564(b)(1) of the Act, 21 U.S.C. section 360bbb-3(b)(1), unless the authorization is terminated or revoked.  Performed at [facility], [address] [place], [place] [address]   Blood culture (routine x 2)     Status: None (Preliminary result)   Collection Time: 05/26/21 10:08 AM   Specimen: BLOOD  Result Value Ref Range Status   Specimen Description BLOOD BLOOD RIGHT FOREARM  Final   Special Requests   Final    BOTTLES DRAWN AEROBIC AND ANAEROBIC Blood Culture results may not be optimal due to an excessive volume of blood received in culture bottles   Culture   Final    NO GROWTH 2 DAYS Performed at [facility], [address] [place], [place] [address]    Report Status PENDING  Incomplete  Blood culture (routine x 2)     Status: None (Preliminary result)   Collection Time: 05/26/21 10:08 AM   Specimen: BLOOD  Result Value Ref Range Status   Specimen Description BLOOD BLOOD RIGHT ARM  Final   Special Requests   Final    BOTTLES DRAWN AEROBIC AND ANAEROBIC Blood Culture adequate volume   Culture   Final    NO GROWTH 2 DAYS Performed at [facility], [address] [place], [place] [address]    Report Status PENDING  Incomplete  Urine Culture     Status: None   Collection Time: 05/26/21 12:00 PM   Specimen: Urine, Random  Result Value Ref Range Status   Specimen Description    Final    URINE, RANDOM Performed at [facility], [address], [place], [place] [address]    Special Requests   Final    Normal Performed at [facility], [address] [place], [place] [address]    Culture   Final    NO GROWTH Performed at [facility], [place] [address], [place], [place] [address]    Report  Status 05/28/2021 FINAL  Final  Aerobic/Anaerobic Culture (surgical/deep wound)     Status: None (Preliminary result)   Collection Time: 05/26/21  2:26 PM   Specimen: Abscess  Result Value Ref Range Status   Specimen Description ABSCESS RIGHT PLEURAL  Final   Special Requests SYRINGE  Final   Gram Stain   Final    ABUNDANT WBC PRESENT, PREDOMINANTLY PMN NO ORGANISMS SEEN    Culture   Final    HOLDING FOR POSSIBLE ANAEROBE Performed at [facility], [address], [place], [place] [address]    Report Status PENDING  Incomplete  Body fluid culture w Gram Stain     Status: None (Preliminary result)   Collection Time: 05/27/21 11:24 AM   Specimen: Pleura; Body Fluid  Result Value Ref Range Status   Specimen Description   Final    PLEURAL Performed at [facility], [address] [place], [place] [address]    Special Requests   Final    PLEURAL Performed at [facility], [address] [place], [place] [address]    Gram Stain   Final    ABUNDANT WBC PRESENT,BOTH PMN AND MONONUCLEAR NO ORGANISMS SEEN    Culture   Final    NO GROWTH 2 DAYS Performed at [facility], [place] [address], [place], [place] [address]    Report Status PENDING  Incomplete         Radiology Studies: DG Chest Port 1 View  Result Date: 05/28/2021 CLINICAL DATA:  Syncope EXAM: PORTABLE CHEST 1 VIEW COMPARISON:  Chest x-ray 05/26/2021, CT chest 05/26/2021 FINDINGS: Interval placement of right lower pigtail drainage catheter. Slight decreased loculated right pleural effusion. Mild cardiomegaly with increased vascular congestion and  probable mild edema. Aortic atherosclerosis. Persistent airspace disease at the right middle lobe and right base IMPRESSION: 1. Interval placement of right lower chest drainage catheter with slight decreased loculated right pleural collection 2. Mild cardiomegaly with interim vascular congestion and mild pulmonary edema 3. Consolidation at the right middle lobe and right base Electronically Signed   By: Donavan Foil M.D.   On: 05/28/2021 00:32        Scheduled Meds: . allopurinol  100 mg Oral Daily  . enoxaparin (LOVENOX) injection  40 mg Subcutaneous Q24H  . lidocaine  1 patch Transdermal Q24H  . methylPREDNISolone (SOLU-MEDROL) injection  40 mg Intravenous Daily  . nicotine  14 mg Transdermal Daily  . traZODone  50 mg Oral QHS   Continuous Infusions: . cefTRIAXone (ROCEPHIN)  IV Stopped (05/28/21 2101)  . metronidazole 500 mg (05/29/21 0601)     LOS: 3 days    Time spent: 15 minutes    Sidney Ace, MD [facility] Pager [phone]  If 7PM-7AM, please contact night-coverage 05/29/2021, 12:53 PM

## 2021-05-29 NOTE — Progress Notes (Signed)
Pulmonary Medicine          Date: 05/29/2021,   MRN# [id number] Tonya Johnson October 03, 1966     AdmissionWeight: 68 kg                 CurrentWeight: 68 kg        HISTORY OF PRESENT ILLNESS   This is a 55 year old lady, a smoker, not covid vaccinated, troubled by gout, followed by chest pain, cane to us with shortness of breath, pain on deep breathing, cough x one week. No prior history of etoh abuse, aspiration or choking spells. Chest ct showed a right air fluid level c/w empyema. Interventional radiologist placed a tube. Sixty cc of pus. She is on flagyl and rocephin. No fever overnight.    today is the best dat day she had. Less pain, improved breathing, stronger. Still draining scant pus.    PAST MEDICAL HISTORY   Past Medical History:  Diagnosis Date  . Hypertension      SURGICAL HISTORY   History reviewed. No pertinent surgical history.   FAMILY HISTORY   Family History  Problem Relation Age of Onset  . Hypertension Mother   . Prostate cancer Father      SOCIAL HISTORY   Social History   Tobacco Use  . Smoking status: Current Every Day Smoker    Packs/day: 0.50    Types: Cigarettes  . Smokeless tobacco: Never Used     MEDICATIONS    Home Medication:    Current Medication:  Current Facility-Administered Medications:  .  allopurinol (ZYLOPRIM) tablet 100 mg, 100 mg, Oral, Daily, Renae GlossWieting, Richard, MD, 100 mg at 05/29/21 0944 .  cefTRIAXone (ROCEPHIN) 2 g in sodium chloride 0.9 % 100 mL IVPB, 2 g, Intravenous, Q24H, Agbata, Tochukwu, MD, Stopped at 05/28/21 2101 .  enoxaparin (LOVENOX) injection 40 mg, 40 mg, Subcutaneous, Q24H, Agbata, Tochukwu, MD, 40 mg at 05/28/21 2032 .  ketorolac (TORADOL) 15 MG/ML injection 15 mg, 15 mg, Intravenous, Q6H PRN, Agbata, Tochukwu, MD, 15 mg at 05/29/21 0807 .  lidocaine (LIDODERM) 5 % 1 patch, 1 patch, Transdermal, Q24H, Concha SeFunke, Mary E, MD, 1 patch at 05/29/21 1030 .  methocarbamol (ROBAXIN)  tablet 500 mg, 500 mg, Oral, Q6H PRN, Sreenath, Sudheer B, MD .  methylPREDNISolone sodium succinate (SOLU-MEDROL) 40 mg/mL injection 40 mg, 40 mg, Intravenous, Daily, Renae GlossWieting, Richard, MD, 40 mg at 05/29/21 0949 .  metroNIDAZOLE (FLAGYL) IVPB 500 mg, 500 mg, Intravenous, Q8H, Agbata, Tochukwu, MD, Last Rate: 100 mL/hr at 05/29/21 1516, 500 mg at 05/29/21 1516 .  nicotine (NICODERM CQ - dosed in mg/24 hours) patch 14 mg, 14 mg, Transdermal, Daily, Agbata, Tochukwu, MD, 14 mg at 05/29/21 0948 .  ondansetron (ZOFRAN) tablet 4 mg, 4 mg, Oral, Q6H PRN **OR** ondansetron (ZOFRAN) injection 4 mg, 4 mg, Intravenous, Q6H PRN, Agbata, Tochukwu, MD .  oxyCODONE (Oxy IR/ROXICODONE) immediate release tablet 5 mg, 5 mg, Oral, Q4H PRN, Georgeann OppenheimSreenath, Sudheer B, MD, 5 mg at 05/29/21 1600 .  traZODone (DESYREL) tablet 50 mg, 50 mg, Oral, QHS, Wieting, Richard, MD, 50 mg at 05/28/21 2029    ALLERGIES   Patient has no known allergies.     REVIEW OF SYSTEMS    Review of Systems:  Gen:  Denies  fever, sweats, chills weigh loss  HEENT: Denies blurred vision, double vision, ear pain, eye pain, hearing loss, nose bleeds, sore throat Cardiac:  No dizziness, chest pain or heaviness, chest tightness,edema Resp:  RARE  cough or sputum porduction,  LESS shortness of breath, rare wheezing, hemoptysis,  Gi: Denies swallowing difficulty, stomach pain, nausea or vomiting, diarrhea, constipation, bowel incontinence Gu:  Denies bladder incontinence, burning urine Ext:   Denies Joint pain, stiffness or swelling Skin: Denies  skin rash, easy bruising or bleeding or hives Endoc:  Denies polyuria, polydipsia , polyphagia or weight change Psych:   Denies depression, insomnia or hallucinations   Other:  All other systems negative   VS: BP (!) 149/71 (BP Location: Left Arm)   Pulse 65   Temp 98 F (36.7 C) (Oral)   Resp 20   Ht 5\' 1"  (1.549 m)   Wt 68 kg   SpO2 97%   BMI 28.34 kg/m      PHYSICAL EXAM     GENERAL:NAD, no fevers, chills, no weakness no fatigue HEAD: Normocephalic, atraumatic.  EYES: Pupils equal, round, reactive to light. Extraocular muscles intact. No scleral icterus.  MOUTH: Moist mucosal membrane. Dentition intact. No abscess noted.  EAR, NOSE, THROAT: Clear without exudates. No external lesions.  NECK: Supple. No thyromegaly. No nodules. No JVD.  PULMONARY: Diffuse coarse rhonchi  CARDIOVASCULAR: S1 and S2. Regular rate and rhythm. No murmurs, rubs, or gallops. No edema. Pedal pulses 2+ bilaterally.  GASTROINTESTINAL: Soft, nontender, nondistended. No masses. Positive bowel sounds. No hepatosplenomegaly.  MUSCULOSKELETAL: No swelling, clubbing, or edema. Range of motion full in all extremities.  NEUROLOGIC: Cranial nerves II through XII are intact. No gross focal neurological deficits. Sensation intact. Reflexes intact.  SKIN: No ulceration, lesions, rashes, or cyanosis. Skin warm and dry. Turgor intact.  PSYCHIATRIC: Mood, affect within normal limits. The patient is awake, alert and oriented x 3. Insight, judgment intact.       IMAGING    DG Chest 2 View  Result Date: 05/26/2021 CLINICAL DATA:  Shortness of breath for 1 week EXAM: CHEST - 2 VIEW COMPARISON:  None. FINDINGS: Sizable right pleural effusion with air-fluid level laterally. The underlying lung is obscured. Clear left lung. Normal heart size. IMPRESSION: Large right pleural effusion with air-fluid level, question symptoms of empyema. Recommend chest CT. Electronically Signed   By: 05/28/2021 M.D.   On: 05/26/2021 10:11   CT Angio Chest PE W and/or Wo Contrast  Result Date: 05/26/2021 CLINICAL DATA:  Short of breath. Patient with right flank pain for 1 week. Current chest radiograph demonstrated right mid to lower lung zone opacity with a right mid lung air-fluid level. EXAM: CT ANGIOGRAPHY CHEST WITH CONTRAST TECHNIQUE: Multidetector CT imaging of the chest was performed using the standard protocol  during bolus administration of intravenous contrast. Multiplanar CT image reconstructions and MIPs were obtained to evaluate the vascular anatomy. CONTRAST:  15mL OMNIPAQUE IOHEXOL 350 MG/ML SOLN COMPARISON:  Current chest radiograph. FINDINGS: Cardiovascular: Pulmonary arteries are well opacified. There is no evidence of a pulmonary embolism. Heart is normal in size and configuration. Trace amount of pericardial fluid. Great vessels are normal in caliber. No aortic dissection or significant atherosclerosis. Arch branch vessels are widely patent. Mediastinum/Nodes: 1.3 cm posterior right thyroid nodule. No follow-up recommended. No neck base or axillary masses or enlarged lymph nodes. No mediastinal or left hilar masses or enlarged lymph nodes. Generalized increased soft tissue along the right hilum suggests mild confluent adenopathy. Trachea and esophagus are unremarkable. Lungs/Pleura: Loculated right pleural effusion in the mid to lower right hemithorax. The lateral portion of this contains an air-fluid level corresponding to the air-fluid level noted on the current chest  radiograph. The overlying visceral pleura appears mildly thickened and irregular. Most of the right lower lobe is collapsed/atelectatic. There is milder compressive atelectasis in the right middle lobe and a portion of the lateral right upper lobe adjacent to the pleural effusion. Remainder of the right lung is clear. Left lung is clear. No left pleural effusion. No pneumothorax. Bronchi appear patent.  No evidence of a centrally obstructing mass. Upper Abdomen: No acute findings. 1.6 cm left adrenal nodule, nonspecific. Musculoskeletal: No fracture or acute finding.  No bone lesion. Review of the MIP images confirms the above findings. IMPRESSION: 1. No evidence of a pulmonary embolism. 2. Loculated right pleural effusion containing an air-fluid level. Findings are suspicious for an empyema. There is associated atelectasis predominantly of the  right lower lobe, to a lesser degree of the right middle lobe and minimally of the right upper lobe. Electronically Signed   By: Amie Portland M.D.   On: 05/26/2021 10:59   DG Chest Port 1 View  Result Date: 05/28/2021 CLINICAL DATA:  Syncope EXAM: PORTABLE CHEST 1 VIEW COMPARISON:  Chest x-ray 05/26/2021, CT chest 05/26/2021 FINDINGS: Interval placement of right lower pigtail drainage catheter. Slight decreased loculated right pleural effusion. Mild cardiomegaly with increased vascular congestion and probable mild edema. Aortic atherosclerosis. Persistent airspace disease at the right middle lobe and right base IMPRESSION: 1. Interval placement of right lower chest drainage catheter with slight decreased loculated right pleural collection 2. Mild cardiomegaly with interim vascular congestion and mild pulmonary edema 3. Consolidation at the right middle lobe and right base Electronically Signed   By: Jasmine Pang M.D.   On: 05/28/2021 00:32   CT IMAGE GUIDED DRAINAGE BY PERCUTANEOUS CATHETER  Result Date: 05/26/2021 INDICATION: 55 year old female with large right pleural effusion, suspicious for empyema. EXAM: CT IMAGE GUIDED DRAINAGE BY PERCUTANEOUS CATHETER COMPARISON:  CT chest from earlier the same day MEDICATIONS: The patient is currently admitted to the hospital and receiving intravenous antibiotics. The antibiotics were administered within an appropriate time frame prior to the initiation of the procedure. ANESTHESIA/SEDATION: Moderate (conscious) sedation was employed during this procedure. A total of Versed 1 mg and Fentanyl 50 mcg was administered intravenously. Moderate Sedation Time: 15 minutes. The patient's level of consciousness and vital signs were monitored continuously by radiology nursing throughout the procedure under my direct supervision. CONTRAST:  None COMPLICATIONS: None immediate. PROCEDURE: Informed written consent was obtained from the patient after a discussion of the risks,  benefits and alternatives to treatment. The patient was placed supine, partially left lateral decubitus on the CT gantry and a pre procedural CT was performed re-demonstrating the known fluid collection within the right pleural space. The procedure was planned. A timeout was performed prior to the initiation of the procedure. The right posterolateral and inferior thorax was prepped and draped in the usual sterile fashion. The overlying soft tissues were anesthetized with 1% lidocaine with epinephrine. Appropriate trajectory was planned with the use of a 22 gauge spinal needle. An 18 gauge trocar needle was advanced into the pleural space and a short Amplatz super stiff wire was coiled within the collection. Appropriate positioning was confirmed with a limited CT scan. The tract was serially dilated allowing placement of a 14 French all-purpose drainage catheter. Appropriate positioning was confirmed with a limited postprocedural CT scan. A total of approximately 60 ml of opaque yellow, purulence fluid was aspirated. Samples were sent to the lab for fluid analysis. The tube was connected to a pleurovac and sutured in place.  A dressing was placed. The patient tolerated the procedure well without immediate post procedural complication. IMPRESSION: Successful CT guided placement of a 31 French all purpose drain catheter into the right empyema with aspiration of approximately 60 mL of purulence fluid. Samples were sent to the laboratory as requested by the ordering clinical team. Marliss Coots, MD [facility] Electronically Signed   By: Marliss Coots MD   On: 05/26/2021 14:44      ASSESSMENT/PLAN  Her presentation is c/w with empyema ( low glucose, super hig ldh, high wbc and pus looking), drain in place. On anearobic coverage.  answered all of her questions. Ambulating  X 3 around the nursing station -continue antibiotics - cxr 05/30/21  Smoker suspect  copd -duo neb q 6 hrs prn   40 min visit          Thank you for allowing me to participate in the care of this patient.   Patient/Family are satisfied with care plan and all questions have been answered.  This document was prepared using Dragon voice recognition software and may include unintentional dictation errors.     Ned Clines, M.D.  Division of Pulmonary & Critical Care Medicine  [facility]

## 2021-05-29 NOTE — Plan of Care (Signed)
  Problem: Clinical Measurements: Goal: Ability to maintain clinical measurements within normal limits will improve Outcome: Progressing Goal: Will remain free from infection Outcome: Progressing Goal: Diagnostic test results will improve Outcome: Progressing Goal: Respiratory complications will improve Outcome: Progressing Goal: Cardiovascular complication will be avoided Outcome: Progressing   Problem: Pain Managment: Goal: General experience of comfort will improve Outcome: Progressing   Pt is involved in and agrees with the plan of care. V/S stable. Placed on oxygen at 2lpm/ as pt was desat at 88% while sleeping. CTT in place; output this shift. Has non-productive cough. Denies any pain.

## 2021-05-30 ENCOUNTER — Inpatient Hospital Stay: Payer: Self-pay

## 2021-05-30 LAB — CBC WITH DIFFERENTIAL/PLATELET
Abs Immature Granulocytes: 0.69 10*3/uL — ABNORMAL HIGH (ref 0.00–0.07)
Basophils Absolute: 0.1 10*3/uL (ref 0.0–0.1)
Basophils Relative: 0 %
Eosinophils Absolute: 0 10*3/uL (ref 0.0–0.5)
Eosinophils Relative: 0 %
HCT: 36.9 % (ref 36.0–46.0)
Hemoglobin: 12.7 g/dL (ref 12.0–15.0)
Immature Granulocytes: 4 %
Lymphocytes Relative: 13 %
Lymphs Abs: 2.6 10*3/uL (ref 0.7–4.0)
MCH: 33.6 pg (ref 26.0–34.0)
MCHC: 34.4 g/dL (ref 30.0–36.0)
MCV: 97.6 fL (ref 80.0–100.0)
Monocytes Absolute: 1.1 10*3/uL — ABNORMAL HIGH (ref 0.1–1.0)
Monocytes Relative: 6 %
Neutro Abs: 15.1 10*3/uL — ABNORMAL HIGH (ref 1.7–7.7)
Neutrophils Relative %: 77 %
Platelets: 516 10*3/uL — ABNORMAL HIGH (ref 150–400)
RBC: 3.78 MIL/uL — ABNORMAL LOW (ref 3.87–5.11)
RDW: 12.9 % (ref 11.5–15.5)
WBC: 19.6 10*3/uL — ABNORMAL HIGH (ref 4.0–10.5)
nRBC: 0 % (ref 0.0–0.2)

## 2021-05-30 LAB — BASIC METABOLIC PANEL
Anion gap: 10 (ref 5–15)
BUN: 17 mg/dL (ref 6–20)
CO2: 25 mmol/L (ref 22–32)
Calcium: 8.8 mg/dL — ABNORMAL LOW (ref 8.9–10.3)
Chloride: 102 mmol/L (ref 98–111)
Creatinine, Ser: 0.52 mg/dL (ref 0.44–1.00)
GFR, Estimated: >60 mL/min (ref >60)
Glucose, Bld: 102 mg/dL — ABNORMAL HIGH (ref 70–99)
Potassium: 3.2 mmol/L — ABNORMAL LOW (ref 3.5–5.1)
Sodium: 137 mmol/L (ref 135–145)

## 2021-05-30 LAB — AEROBIC/ANAEROBIC CULTURE W GRAM STAIN (SURGICAL/DEEP WOUND)

## 2021-05-30 MED ORDER — POTASSIUM CHLORIDE CRYS ER 20 MEQ PO TBCR
40.0000 meq | EXTENDED_RELEASE_TABLET | Freq: Once | ORAL | Status: AC
  Administered 2021-05-30: 40 meq via ORAL
  Filled 2021-05-30: qty 2

## 2021-05-30 MED ORDER — AMLODIPINE BESYLATE 10 MG PO TABS
10.0000 mg | ORAL_TABLET | Freq: Every day | ORAL | Status: DC
  Administered 2021-05-30 – 2021-06-03 (×5): 10 mg via ORAL
  Filled 2021-05-30 (×5): qty 1

## 2021-05-30 NOTE — Progress Notes (Signed)
Pulmonary Medicine     HISTORY OF PRESENT ILLNESS   Continues to feel stronger, less discomfort, easier breathing. Tube treation slight yellow, cloudy exudate. Discussed d/c plans if all continues to go well. Reviewed cxr on monitor with her.    PAST MEDICAL HISTORY   Past Medical History:  Diagnosis Date  . Hypertension      SURGICAL HISTORY   History reviewed. No pertinent surgical history.   FAMILY HISTORY   Family History  Problem Relation Age of Onset  . Hypertension Mother   . Prostate cancer Father      SOCIAL HISTORY   Social History   Tobacco Use  . Smoking status: Current Every Day Smoker    Packs/day: 0.50    Types: Cigarettes  . Smokeless tobacco: Never Used     MEDICATIONS    Home Medication:    Current Medication:  Current Facility-Administered Medications:  .  allopurinol (ZYLOPRIM) tablet 100 mg, 100 mg, Oral, Daily, Renae GlossWieting, Richard, MD, 100 mg at 05/30/21 0903 .  amLODipine (NORVASC) tablet 10 mg, 10 mg, Oral, Daily, Sreenath, Sudheer B, MD, 10 mg at 05/30/21 0904 .  Ampicillin-Sulbactam (UNASYN) 3 g in sodium chloride 0.9 % 100 mL IVPB, 3 g, Intravenous, Q6H, Sreenath, Sudheer B, MD, Last Rate: 200 mL/hr at 05/30/21 0913, 3 g at 05/30/21 0913 .  enoxaparin (LOVENOX) injection 40 mg, 40 mg, Subcutaneous, Q24H, Agbata, Tochukwu, MD, 40 mg at 05/29/21 2132 .  ketorolac (TORADOL) 15 MG/ML injection 15 mg, 15 mg, Intravenous, Q6H PRN, Agbata, Tochukwu, MD, 15 mg at 05/29/21 0807 .  lidocaine (LIDODERM) 5 % 1 patch, 1 patch, Transdermal, Q24H, Concha SeFunke, Mary E, MD, 1 patch at 05/30/21 0900 .  methocarbamol (ROBAXIN) tablet 500 mg, 500 mg, Oral, Q6H PRN, Sreenath, Sudheer B, MD .  methylPREDNISolone sodium succinate (SOLU-MEDROL) 40 mg/mL injection 40 mg, 40 mg, Intravenous, Daily, Renae GlossWieting, Richard, MD, 40 mg at 05/30/21 0904 .  nicotine (NICODERM CQ - dosed in mg/24 hours) patch 14 mg, 14 mg, Transdermal, Daily, Agbata, Tochukwu, MD, 14 mg  at 05/30/21 0902 .  ondansetron (ZOFRAN) tablet 4 mg, 4 mg, Oral, Q6H PRN **OR** ondansetron (ZOFRAN) injection 4 mg, 4 mg, Intravenous, Q6H PRN, Agbata, Tochukwu, MD .  oxyCODONE (Oxy IR/ROXICODONE) immediate release tablet 5 mg, 5 mg, Oral, Q4H PRN, Georgeann OppenheimSreenath, Sudheer B, MD, 5 mg at 05/30/21 1200 .  potassium chloride SA (KLOR-CON) CR tablet 40 mEq, 40 mEq, Oral, Once, Lowella BandyGrubb, Rodney D, RPH .  traZODone (DESYREL) tablet 50 mg, 50 mg, Oral, QHS, Wieting, Richard, MD, 50 mg at 05/29/21 2132    ALLERGIES   Patient has no known allergies.     REVIEW OF SYSTEMS    Review of Systems:  Gen:  Denies  fever, sweats, chills weigh loss  HEENT: Denies blurred vision, double vision, ear pain, eye pain, hearing loss, nose bleeds, sore throat Cardiac:  No dizziness, chest pain or heaviness, chest tightness,edema Resp:   less cough or sputum porduction, shortness of breath,wheezing, hemoptysis,  Gi: Denies swallowing difficulty, stomach pain, nausea or vomiting, diarrhea, constipation, bowel incontinence Gu:  Denies bladder incontinence, burning urine Ext:   Denies Joint pain, stiffness or swelling Skin: Denies  skin rash, easy bruising or bleeding or hives Endoc:  Denies polyuria, polydipsia , polyphagia or weight change Psych:   Denies depression, insomnia or hallucinations   Other:  All other systems negative   VS: BP 132/85 (BP Location: Right Arm)   Pulse 71  Temp 98.5 F (36.9 C) (Oral)   Resp 18   Ht 5\' 1"  (1.549 m)   Wt 68 kg   SpO2 96%   BMI 28.34 kg/m      PHYSICAL EXAM    GENERAL:NAD, no fevers, chills, no weakness no fatigue HEAD: Normocephalic, atraumatic.  EYES: Pupils equal, round, reactive to light. Extraocular muscles intact. No scleral icterus.  MOUTH: Moist mucosal membrane. Dentition intact. No abscess noted.  EAR, NOSE, THROAT: Clear without exudates. No external lesions.  NECK: Supple. No thyromegaly. No nodules. No JVD.  PULMONARY: rare exp wheezes,  lower right chest dullness CARDIOVASCULAR: S1 and S2. Regular rate and rhythm. No murmurs, rubs, or gallops. No edema. Pedal pulses 2+ bilaterally.  GASTROINTESTINAL: Soft, nontender, nondistended. No masses. Positive bowel sounds. No hepatosplenomegaly.  MUSCULOSKELETAL: No swelling, clubbing, or edema. Range of motion full in all extremities.  NEUROLOGIC: Cranial nerves II through XII are intact. No gross focal neurological deficits. Sensation intact. Reflexes intact.  SKIN: No ulceration, lesions, rashes, or cyanosis. Skin warm and dry. Turgor intact.  PSYCHIATRIC: Mood, affect within normal limits. The patient is awake, alert and oriented x 3. Insight, judgment intact.       IMAGING    DG Chest 2 View  Result Date: 05/26/2021 CLINICAL DATA:  Shortness of breath for 1 week EXAM: CHEST - 2 VIEW COMPARISON:  None. FINDINGS: Sizable right pleural effusion with air-fluid level laterally. The underlying lung is obscured. Clear left lung. Normal heart size. IMPRESSION: Large right pleural effusion with air-fluid level, question symptoms of empyema. Recommend chest CT. Electronically Signed   By: 05/28/2021 M.D.   On: 05/26/2021 10:11   CT Angio Chest PE W and/or Wo Contrast  Result Date: 05/26/2021 CLINICAL DATA:  Short of breath. Patient with right flank pain for 1 week. Current chest radiograph demonstrated right mid to lower lung zone opacity with a right mid lung air-fluid level. EXAM: CT ANGIOGRAPHY CHEST WITH CONTRAST TECHNIQUE: Multidetector CT imaging of the chest was performed using the standard protocol during bolus administration of intravenous contrast. Multiplanar CT image reconstructions and MIPs were obtained to evaluate the vascular anatomy. CONTRAST:  63mL OMNIPAQUE IOHEXOL 350 MG/ML SOLN COMPARISON:  Current chest radiograph. FINDINGS: Cardiovascular: Pulmonary arteries are well opacified. There is no evidence of a pulmonary embolism. Heart is normal in size and configuration.  Trace amount of pericardial fluid. Great vessels are normal in caliber. No aortic dissection or significant atherosclerosis. Arch branch vessels are widely patent. Mediastinum/Nodes: 1.3 cm posterior right thyroid nodule. No follow-up recommended. No neck base or axillary masses or enlarged lymph nodes. No mediastinal or left hilar masses or enlarged lymph nodes. Generalized increased soft tissue along the right hilum suggests mild confluent adenopathy. Trachea and esophagus are unremarkable. Lungs/Pleura: Loculated right pleural effusion in the mid to lower right hemithorax. The lateral portion of this contains an air-fluid level corresponding to the air-fluid level noted on the current chest radiograph. The overlying visceral pleura appears mildly thickened and irregular. Most of the right lower lobe is collapsed/atelectatic. There is milder compressive atelectasis in the right middle lobe and a portion of the lateral right upper lobe adjacent to the pleural effusion. Remainder of the right lung is clear. Left lung is clear. No left pleural effusion. No pneumothorax. Bronchi appear patent.  No evidence of a centrally obstructing mass. Upper Abdomen: No acute findings. 1.6 cm left adrenal nodule, nonspecific. Musculoskeletal: No fracture or acute finding.  No bone lesion. Review of the  MIP images confirms the above findings. IMPRESSION: 1. No evidence of a pulmonary embolism. 2. Loculated right pleural effusion containing an air-fluid level. Findings are suspicious for an empyema. There is associated atelectasis predominantly of the right lower lobe, to a lesser degree of the right middle lobe and minimally of the right upper lobe. Electronically Signed   By: Amie Portland M.D.   On: 05/26/2021 10:59   DG Chest Port 1 View  Result Date: 05/30/2021 CLINICAL DATA:  55 year old female with right lung empyema. EXAM: PORTABLE CHEST 1 VIEW COMPARISON:  To below chest 05/28/2021.  CTA chest 05/26/2021. FINDINGS:  Portable AP upright view at 0510 hours. Stable right pigtail pleural drain. Moderate residual patchy and veiling right lower lung opacity has only mildly improved since the prior CTA. No pneumothorax identified. Mediastinal contours remain within normal limits. Left lung remains negative. Visualized tracheal air column is within normal limits. No acute osseous abnormality identified. IMPRESSION: 1. Right pigtail pleural drain with mildly improved patchy and confluent right lower lung opacity related to empyema and atelectasis since the CTA on 05/26/2021. 2. No new cardiopulmonary abnormality. Electronically Signed   By: Odessa Cana Mignano M.D.   On: 05/30/2021 07:54   DG Chest Port 1 View  Result Date: 05/28/2021 CLINICAL DATA:  Syncope EXAM: PORTABLE CHEST 1 VIEW COMPARISON:  Chest x-ray 05/26/2021, CT chest 05/26/2021 FINDINGS: Interval placement of right lower pigtail drainage catheter. Slight decreased loculated right pleural effusion. Mild cardiomegaly with increased vascular congestion and probable mild edema. Aortic atherosclerosis. Persistent airspace disease at the right middle lobe and right base IMPRESSION: 1. Interval placement of right lower chest drainage catheter with slight decreased loculated right pleural collection 2. Mild cardiomegaly with interim vascular congestion and mild pulmonary edema 3. Consolidation at the right middle lobe and right base Electronically Signed   By: Jasmine Pang M.D.   On: 05/28/2021 00:32   CT IMAGE GUIDED DRAINAGE BY PERCUTANEOUS CATHETER  Result Date: 05/26/2021 INDICATION: 55 year old female with large right pleural effusion, suspicious for empyema. EXAM: CT IMAGE GUIDED DRAINAGE BY PERCUTANEOUS CATHETER COMPARISON:  CT chest from earlier the same day MEDICATIONS: The patient is currently admitted to the hospital and receiving intravenous antibiotics. The antibiotics were administered within an appropriate time frame prior to the initiation of the procedure.  ANESTHESIA/SEDATION: Moderate (conscious) sedation was employed during this procedure. A total of Versed 1 mg and Fentanyl 50 mcg was administered intravenously. Moderate Sedation Time: 15 minutes. The patient's level of consciousness and vital signs were monitored continuously by radiology nursing throughout the procedure under my direct supervision. CONTRAST:  None COMPLICATIONS: None immediate. PROCEDURE: Informed written consent was obtained from the patient after a discussion of the risks, benefits and alternatives to treatment. The patient was placed supine, partially left lateral decubitus on the CT gantry and a pre procedural CT was performed re-demonstrating the known fluid collection within the right pleural space. The procedure was planned. A timeout was performed prior to the initiation of the procedure. The right posterolateral and inferior thorax was prepped and draped in the usual sterile fashion. The overlying soft tissues were anesthetized with 1% lidocaine with epinephrine. Appropriate trajectory was planned with the use of a 22 gauge spinal needle. An 18 gauge trocar needle was advanced into the pleural space and a short Amplatz super stiff wire was coiled within the collection. Appropriate positioning was confirmed with a limited CT scan. The tract was serially dilated allowing placement of a 14 French all-purpose drainage catheter. Appropriate  positioning was confirmed with a limited postprocedural CT scan. A total of approximately 60 ml of opaque yellow, purulence fluid was aspirated. Samples were sent to the lab for fluid analysis. The tube was connected to a pleurovac and sutured in place. A dressing was placed. The patient tolerated the procedure well without immediate post procedural complication. IMPRESSION: Successful CT guided placement of a 66 French all purpose drain catheter into the right empyema with aspiration of approximately 60 mL of purulence fluid. Samples were sent to the  laboratory as requested by the ordering clinical team. Marliss Coots, MD [facility] Electronically Signed   By: Marliss Coots MD   On: 05/26/2021 14:44      ASSESSMENT/PLAN   Her presentation is c/w with empyema ( low glucose, super hig ldh, high wbc and pus looking), drain in place. On anearobic coverage. Plural effusion down a little. obviuosly better than on admission.  -continue antibiotics -insure tube is not kinking -per response over the wk end will consider going home with chest tube and out patient thoracic surgery inclusion  Smoker suspect copd -continue as is     Thank you for allowing me to participate in the care of this patient.   Patient/Family are satisfied with care plan and all questions have been answered.  This document was prepared using Dragon voice recognition software and may include unintentional dictation errors.     Ned Clines, M.D.  Division of Pulmonary & Critical Care Medicine  [facility]

## 2021-05-30 NOTE — Progress Notes (Signed)
PROGRESS NOTE    Tonya Johnson  IOM:[id number] DOB: 04-04-1966 DOA: 05/26/2021 PCP: Lynnell Jude, MD  Brief Narrative:  55 year old female with history of hypertension and gout presents with a weeks worth of pain on her right side.  Presented to the hospital 5/30 and found to have an empyema.  Had chest tube placed by interventional radiology.  Pulmonology consulted and following for chest tube management. 6/1: Patient remains hemodynamically stable.  Small amount of purulent fluid, 60 cc liberated upon placement of tube.  Follow-up chest x-ray shows slightly better size of effusion. 6/3: Follow-up chest x-ray demonstrates slight increase in size of right-sided pleural effusion.  Chest tube remains in place.  Patient feels well overall   Assessment & Plan:   Principal Problem:   Empyema, right ([place]) Active Problems:   Acute lower UTI   Essential hypertension   Gout   Nicotine dependence   Hypokalemia   Sepsis ([place])   Pleuritic chest pain   Acute cystitis with hematuria   Diarrhea   Insomnia  Right side empyema Sepsis secondary to above Acute hypoxic respiratory failure secondary to above Sepsis diagnoses met with tachycardia, tachypnea, leukocytosis Chest tube to remain in place 6/3: Patient feels well.  Follow-up chest x-ray slight interval decrease in size of right pleural effusion Fluid culture with few Fusobacterium nucleatum Plan: Continue chest tube Continue Unasyn per pharmacy recommendations Pulmonary follow-up for chest tube management Daily labs Vitals per unit protocol Follow fever curve Patient has been stable on room air with only slight desaturation 88% at night.  Oxygen as necessary Repeat chest x-ray in a.m.  Pleuritic type chest pain Appears improved after addition of Solu-Medrol, will continue  Acute cystitis associated with hematuria On antibiotics for empyema Follow urine culture, no growth  Acute diarrhea Resolved after discontinuation of  senna  Insomnia Nightly trazodone  History of gout Continue allopurinol  History of hypertension Suboptimal control over interval Home Norvasc 10 mg a day ordered    DVT prophylaxis: SQ Lovenox Code Status: Full code Family Communication: Family member at bedside 6/1, 6/2 Disposition Plan: Status is: Inpatient  Remains inpatient appropriate because:Inpatient level of care appropriate due to severity of illness   Dispo: The patient is from: Home              Anticipated d/c is to: Home              Patient currently is not medically stable to d/c.   Difficult to place patient No  Empyema chest tube in place.  Pulmonary following for chest tube management.     Level of care: Med-Surg  Consultants:   Pulmonology  Procedures:  Chest tube  Antimicrobials:    Unasyn   Subjective: Patient seen and examined.  Interval improvement in chest discomfort.  Sitting up in chair.  Feels well overall.  Objective: Vitals:   05/29/21 1923 05/30/21 0416 05/30/21 0912 05/30/21 1117  BP: (!) 144/73 (!) 148/83 135/79 132/85  Pulse: 65 71 73 71  Resp: _0 Temp: 98.6 F (37 C) 98.9 F (37.2 C) 98 F (36.7 C) 98.5 F (36.9 C)  TempSrc: Oral Oral Oral Oral  SpO2: 97% 93% 96% 96%  Weight:      Height:        Intake/Output Summary (Last 24 hours) at 05/30/2021 1211 Last data filed at 05/30/2021 0700 Gross per 24 hour  Intake 420 ml  Output 100 ml  Net 320 ml  Filed Weights   05/26/21 0923  Weight: 68 kg    Examination:  General exam: Appears calm and comfortable  Respiratory system: Decreased lung sounds on right.  Normal work of breathing.  3 L Cardiovascular system: S1 & S2 heard, RRR. No JVD, murmurs, rubs, gallops or clicks. No pedal edema. Gastrointestinal system: Abdomen is nondistended, soft and nontender. No organomegaly or masses felt. Normal bowel sounds heard. Central nervous system: Alert and oriented. No focal neurological  deficits. Extremities: Symmetric 5 x 5 power. Skin: No rashes, lesions or ulcers Psychiatry: Judgement and insight appear normal. Mood & affect appropriate.     Data Reviewed: I have personally reviewed following labs and imaging studies  CBC: Recent Labs  Lab 05/26/21 0926 05/27/21 0644 05/28/21 0552 05/29/21 0728 05/30/21 0538  WBC 24.2* 20.2* 21.1* 19.4* 19.6*  NEUTROABS 19.4*  --  17.8* 15.2* 15.1*  HGB 13.3 11.0* 11.1* 12.0 12.7  HCT 38.0 32.5* 32.8* 35.5* 36.9  MCV 95.0 97.9 97.6 98.6 97.6  PLT 374 353 398 489* 734*   Basic Metabolic Panel: Recent Labs  Lab 05/26/21 0926 05/26/21 0935 05/27/21 0644 05/28/21 0552 05/29/21 0728 05/30/21 0538  NA 130*  --  134* 139 138 137  K 2.4*  --  3.6 2.8* 3.1* 3.2*  CL 85*  --  96* 99 101 102  CO2 32  --  _0 GLUCOSE 136*  --  145* 124* 110* 102*  BUN 8  --  _1 CREATININE 0.57  --  0.53 0.48 0.50 0.52  CALCIUM 9.0  --  8.8* 8.8* 8.9 8.8*  MG  --  2.0  --   --   --   --    GFR: Estimated Creatinine Clearance: 70.9 mL/min (by C-G formula based on SCr of 0.52 mg/dL). Liver Function Tests: Recent Labs  Lab 05/26/21 0926  AST 18  ALT 19  ALKPHOS 132*  BILITOT 0.8  PROT 6.8  ALBUMIN 2.6*   Recent Labs  Lab 05/26/21 0926  LIPASE 21   No results for input(s): AMMONIA in the last 168 hours. Coagulation Profile: Recent Labs  Lab 05/27/21 0644  INR 1.1   Cardiac Enzymes: No results for input(s): CKTOTAL, CKMB, CKMBINDEX, TROPONINI in the last 168 hours. BNP (last 3 results) No results for input(s): PROBNP in the last 8760 hours. HbA1C: No results for input(s): HGBA1C in the last 72 hours. CBG: No results for input(s): GLUCAP in the last 168 hours. Lipid Profile: No results for input(s): CHOL, HDL, LDLCALC, TRIG, CHOLHDL, LDLDIRECT in the last 72 hours. Thyroid Function Tests: No results for input(s): TSH, T4TOTAL, FREET4, T3FREE, THYROIDAB in the last 72 hours. Anemia Panel: No results  for input(s): VITAMINB12, FOLATE, FERRITIN, TIBC, IRON, RETICCTPCT in the last 72 hours. Sepsis Labs: Recent Labs  Lab 05/26/21 1000 05/26/21 1200 05/27/21 0644  PROCALCITON  --   --  0.46  LATICACIDVEN 1.8 1.9  --     Recent Results (from the past 240 hour(s))  Resp Panel by RT-PCR (Flu A&B, Covid) Nasopharyngeal Swab     Status: None   Collection Time: 05/26/21  9:40 AM   Specimen: Nasopharyngeal Swab; Nasopharyngeal(NP) swabs in vial transport medium  Result Value Ref Range Status   SARS Coronavirus 2 by RT PCR NEGATIVE NEGATIVE Final    Comment: (NOTE) SARS-CoV-2 target nucleic acids are NOT DETECTED.  The SARS-CoV-2 RNA is generally detectable in upper respiratory specimens during the acute phase of infection.  The lowest concentration of SARS-CoV-2 viral copies this assay can detect is 138 copies/mL. A negative result does not preclude SARS-Cov-2 infection and should not be used as the sole basis for treatment or other patient management decisions. A negative result may occur with  improper specimen collection/handling, submission of specimen other than nasopharyngeal swab, presence of viral mutation(s) within the areas targeted by this assay, and inadequate number of viral copies(<138 copies/mL). A negative result must be combined with clinical observations, patient history, and epidemiological information. The expected result is Negative.  Fact Sheet for Patients:  EntrepreneurPulse.com.au  Fact Sheet for Healthcare Providers:  IncredibleEmployment.be  This test is no t yet approved or cleared by the [place] FDA and  has been authorized for detection and/or diagnosis of SARS-CoV-2 by FDA under an Emergency Use Authorization (EUA). This EUA will remain  in effect (meaning this test can be used) for the duration of the COVID-19 declaration under Section 564(b)(1) of the Act, 21 U.S.C.section 360bbb-3(b)(1), unless the  authorization is terminated  or revoked sooner.       Influenza A by PCR NEGATIVE NEGATIVE Final   Influenza B by PCR NEGATIVE NEGATIVE Final    Comment: (NOTE) The Xpert Xpress SARS-CoV-2/FLU/RSV plus assay is intended as an aid in the diagnosis of influenza from Nasopharyngeal swab specimens and should not be used as a sole basis for treatment. Nasal washings and aspirates are unacceptable for Xpert Xpress SARS-CoV-2/FLU/RSV testing.  Fact Sheet for Patients: EntrepreneurPulse.com.au  Fact Sheet for Healthcare Providers: IncredibleEmployment.be  This test is not yet approved or cleared by the [place] FDA and has been authorized for detection and/or diagnosis of SARS-CoV-2 by FDA under an Emergency Use Authorization (EUA). This EUA will remain in effect (meaning this test can be used) for the duration of the COVID-19 declaration under Section 564(b)(1) of the Act, 21 U.S.C. section 360bbb-3(b)(1), unless the authorization is terminated or revoked.  Performed at [facility], [address] [place], [place] [address]   Blood culture (routine x 2)     Status: None (Preliminary result)   Collection Time: 05/26/21 10:08 AM   Specimen: BLOOD  Result Value Ref Range Status   Specimen Description BLOOD BLOOD RIGHT FOREARM  Final   Special Requests   Final    BOTTLES DRAWN AEROBIC AND ANAEROBIC Blood Culture results may not be optimal due to an excessive volume of blood received in culture bottles   Culture   Final    NO GROWTH 4 DAYS Performed at [facility], [address], [place], [place] [address]    Report Status PENDING  Incomplete  Blood culture (routine x 2)     Status: None (Preliminary result)   Collection Time: 05/26/21 10:08 AM   Specimen: BLOOD  Result Value Ref Range Status   Specimen Description BLOOD BLOOD RIGHT ARM  Final   Special Requests   Final    BOTTLES DRAWN AEROBIC AND ANAEROBIC Blood  Culture adequate volume   Culture   Final    NO GROWTH 4 DAYS Performed at [facility], [address], [place], [place] [address]    Report Status PENDING  Incomplete  Urine Culture     Status: None   Collection Time: 05/26/21 12:00 PM   Specimen: Urine, Random  Result Value Ref Range Status   Specimen Description   Final    URINE, RANDOM Performed at [facility], [address] [place], [place] [address]    Special  Requests   Final    Normal Performed at [facility], [address], [place], [place] [address]    Culture   Final    NO GROWTH Performed at [facility], [place] [address] [place], [place] [address]    Report Status 05/28/2021 FINAL  Final  Aerobic/Anaerobic Culture (surgical/deep wound)     Status: None   Collection Time: 05/26/21  2:26 PM   Specimen: Abscess  Result Value Ref Range Status   Specimen Description ABSCESS RIGHT PLEURAL  Final   Special Requests SYRINGE  Final   Gram Stain   Final    ABUNDANT WBC PRESENT, PREDOMINANTLY PMN NO ORGANISMS SEEN    Culture   Final    FEW FUSOBACTERIUM NUCLEATUM BETA LACTAMASE NEGATIVE Performed at [facility], [place] [address] [place], [place] [address]    Report Status 05/30/2021 FINAL  Final  Body fluid culture w Gram Stain     Status: None (Preliminary result)   Collection Time: 05/27/21 11:24 AM   Specimen: Pleura; Body Fluid  Result Value Ref Range Status   Specimen Description   Final    PLEURAL Performed at [facility], [address] [place], [place] [address]    Special Requests   Final    PLEURAL Performed at [facility], [address] [place], [place] [address]    Gram Stain   Final    ABUNDANT WBC PRESENT,BOTH PMN AND MONONUCLEAR NO ORGANISMS SEEN    Culture   Final    NO GROWTH 3 DAYS Performed at [facility], [place] [address], [place], [place] [address]    Report Status PENDING  Incomplete         Radiology  Studies: DG Chest Port 1 View  Result Date: 05/30/2021 CLINICAL DATA:  55 year old female with right lung empyema. EXAM: PORTABLE CHEST 1 VIEW COMPARISON:  To below chest 05/28/2021.  CTA chest 05/26/2021. FINDINGS: Portable AP upright view at 0510 hours. Stable right pigtail pleural drain. Moderate residual patchy and veiling right lower lung opacity has only mildly improved since the prior CTA. No pneumothorax identified. Mediastinal contours remain within normal limits. Left lung remains negative. Visualized tracheal air column is within normal limits. No acute osseous abnormality identified. IMPRESSION: 1. Right pigtail pleural drain with mildly improved patchy and confluent right lower lung opacity related to empyema and atelectasis since the CTA on 05/26/2021. 2. No new cardiopulmonary abnormality. Electronically Signed   By: Genevie Ann M.D.   On: 05/30/2021 07:54        Scheduled Meds: . allopurinol  100 mg Oral Daily  . amLODipine  10 mg Oral Daily  . enoxaparin (LOVENOX) injection  40 mg Subcutaneous Q24H  . lidocaine  1 patch Transdermal Q24H  . methylPREDNISolone (SOLU-MEDROL) injection  40 mg Intravenous Daily  . nicotine  14 mg Transdermal Daily  . traZODone  50 mg Oral QHS   Continuous Infusions: . ampicillin-sulbactam (UNASYN) IV 3 g (05/30/21 0913)     LOS: 4 days    Time spent: 15 minutes    Sidney Ace, MD [facility] Pager [phone]  If 7PM-7AM, please contact night-coverage 05/30/2021, 12:11 PM

## 2021-05-31 LAB — CULTURE, BLOOD (ROUTINE X 2)
Culture: NO GROWTH
Culture: NO GROWTH
Special Requests: ADEQUATE

## 2021-05-31 LAB — BODY FLUID CULTURE W GRAM STAIN: Culture: NO GROWTH

## 2021-05-31 LAB — CBC WITH DIFFERENTIAL/PLATELET
Abs Immature Granulocytes: 0.5 10*3/uL — ABNORMAL HIGH (ref 0.00–0.07)
Basophils Absolute: 0 10*3/uL (ref 0.0–0.1)
Basophils Relative: 0 %
Eosinophils Absolute: 0 10*3/uL (ref 0.0–0.5)
Eosinophils Relative: 0 %
HCT: 36.4 % (ref 36.0–46.0)
Hemoglobin: 12.5 g/dL (ref 12.0–15.0)
Immature Granulocytes: 3 %
Lymphocytes Relative: 17 %
Lymphs Abs: 3 10*3/uL (ref 0.7–4.0)
MCH: 33.6 pg (ref 26.0–34.0)
MCHC: 34.3 g/dL (ref 30.0–36.0)
MCV: 97.8 fL (ref 80.0–100.0)
Monocytes Absolute: 1 10*3/uL (ref 0.1–1.0)
Monocytes Relative: 6 %
Neutro Abs: 13.3 10*3/uL — ABNORMAL HIGH (ref 1.7–7.7)
Neutrophils Relative %: 74 %
Platelets: 500 10*3/uL — ABNORMAL HIGH (ref 150–400)
RBC: 3.72 MIL/uL — ABNORMAL LOW (ref 3.87–5.11)
RDW: 12.7 % (ref 11.5–15.5)
WBC: 18 10*3/uL — ABNORMAL HIGH (ref 4.0–10.5)
nRBC: 0 % (ref 0.0–0.2)

## 2021-05-31 LAB — BASIC METABOLIC PANEL
Anion gap: 9 (ref 5–15)
BUN: 18 mg/dL (ref 6–20)
CO2: 26 mmol/L (ref 22–32)
Calcium: 8.9 mg/dL (ref 8.9–10.3)
Chloride: 103 mmol/L (ref 98–111)
Creatinine, Ser: 0.4 mg/dL — ABNORMAL LOW (ref 0.44–1.00)
GFR, Estimated: >60 mL/min (ref >60)
Glucose, Bld: 101 mg/dL — ABNORMAL HIGH (ref 70–99)
Potassium: 3.7 mmol/L (ref 3.5–5.1)
Sodium: 138 mmol/L (ref 135–145)

## 2021-05-31 MED ORDER — SODIUM CHLORIDE 0.9 % IV SOLN
INTRAVENOUS | Status: DC | PRN
  Administered 2021-05-31 (×2): 250 mL via INTRAVENOUS

## 2021-05-31 NOTE — Progress Notes (Signed)
PROGRESS NOTE    Tonya Johnson  GLO:[id number] DOB: 06/09/1966 DOA: 05/26/2021 PCP: Lynnell Jude, MD  Brief Narrative:  55 year old female with history of hypertension and gout presents with a weeks worth of pain on her right side.  Presented to the hospital 5/30 and found to have an empyema.  Had chest tube placed by interventional radiology.  Pulmonology consulted and following for chest tube management. 6/1: Patient remains hemodynamically stable.  Small amount of purulent fluid, 60 cc liberated upon placement of tube.  Follow-up chest x-ray shows slightly better size of effusion. 6/3: Follow-up chest x-ray demonstrates slight increase in size of right-sided pleural effusion.  Chest tube remains in place.  Patient feels well overall 6/4: WBC decreasing.  Patient feels well overall   Assessment & Plan:   Principal Problem:   Empyema, right ([place]) Active Problems:   Acute lower UTI   Essential hypertension   Gout   Nicotine dependence   Hypokalemia   Sepsis ([place])   Pleuritic chest pain   Acute cystitis with hematuria   Diarrhea   Insomnia  Right side empyema Sepsis secondary to above Acute hypoxic respiratory failure secondary to above Sepsis diagnoses met with tachycardia, tachypnea, leukocytosis Chest tube to remain in place 6/3: Patient feels well.  Follow-up chest x-ray slight interval decrease in size of right pleural effusion Fluid culture with few Fusobacterium nucleatum Plan: Continue chest tube Continue Unasyn per pharmacy recommendations Pulmonary follow-up for chest tube management Daily labs Vitals per unit protocol Follow fever curve Patient has been stable on room air with only slight desaturation 88% at night.  Oxygen as necessary Repeat chest x-ray 06/01/2021  Pleuritic type chest pain Appears improved after addition of Solu-Medrol, will continue  Acute cystitis associated with hematuria On antibiotics for empyema Follow urine culture, no  growth  Acute diarrhea Resolved after discontinuation of senna  Insomnia Nightly trazodone  History of gout Continue allopurinol  History of hypertension Improved control over interval Home Norvasc 10 mg a day ordered    DVT prophylaxis: SQ Lovenox Code Status: Full code Family Communication: Family member at bedside 6/1, 6/2, 6/4 Disposition Plan: Status is: Inpatient  Remains inpatient appropriate because:Inpatient level of care appropriate due to severity of illness   Dispo: The patient is from: Home              Anticipated d/c is to: Home              Patient currently is not medically stable to d/c.   Difficult to place patient No  Empyema chest tube in place.  Pulmonary following for chest tube management.     Level of care: Med-Surg  Consultants:   Pulmonology  Procedures:  Chest tube  Antimicrobials:    Unasyn   Subjective: Patient seen and examined.  Interval improvement in chest discomfort.  Sitting up in chair.  Feels well overall.  Objective: Vitals:   05/30/21 1529 05/30/21 1929 05/31/21 0426 05/31/21 0800  BP: 129/79 (!) 144/89 (!) 158/92 (!) 147/71  Pulse: 65 69 64 71  Resp: _0 Temp: 98.3 F (36.8 C) 98.5 F (36.9 C) 98.7 F (37.1 C) 98.6 F (37 C)  TempSrc: Oral Oral Oral Oral  SpO2: 94% 96% 96% 95%  Weight:      Height:        Intake/Output Summary (Last 24 hours) at 05/31/2021 1049 Last data filed at 05/31/2021 0900 Gross per 24 hour  Intake 1030 ml  Output 60 ml  Net 970 ml   Filed Weights   05/26/21 0923  Weight: 68 kg    Examination:  General exam: Appears calm and comfortable  Respiratory system: Decreased lung sounds on right.  Chest tube in place.  Normal work of breathing.  3 L Cardiovascular system: S1 & S2 heard, RRR. No JVD, murmurs, rubs, gallops or clicks. No pedal edema. Gastrointestinal system: Abdomen is nondistended, soft and nontender. No organomegaly or masses felt. Normal bowel sounds  heard. Central nervous system: Alert and oriented. No focal neurological deficits. Extremities: Symmetric 5 x 5 power. Skin: No rashes, lesions or ulcers Psychiatry: Judgement and insight appear normal. Mood & affect appropriate.     Data Reviewed: I have personally reviewed following labs and imaging studies  CBC: Recent Labs  Lab 05/26/21 0926 05/27/21 0644 05/28/21 0552 05/29/21 0728 05/30/21 0538 05/31/21 0417  WBC 24.2* 20.2* 21.1* 19.4* 19.6* 18.0*  NEUTROABS 19.4*  --  17.8* 15.2* 15.1* 13.3*  HGB 13.3 11.0* 11.1* 12.0 12.7 12.5  HCT 38.0 32.5* 32.8* 35.5* 36.9 36.4  MCV 95.0 97.9 97.6 98.6 97.6 97.8  PLT 374 353 398 489* 516* 599*   Basic Metabolic Panel: Recent Labs  Lab 05/26/21 0935 05/27/21 0644 05/28/21 0552 05/29/21 0728 05/30/21 0538 05/31/21 0417  NA  --  134* 139 138 137 138  K  --  3.6 2.8* 3.1* 3.2* 3.7  CL  --  96* 99 101 102 103  CO2  --  _0 GLUCOSE  --  145* 124* 110* 102* 101*  BUN  --  _1 CREATININE  --  0.53 0.48 0.50 0.52 0.40*  CALCIUM  --  8.8* 8.8* 8.9 8.8* 8.9  MG 2.0  --   --   --   --   --    GFR: Estimated Creatinine Clearance: 70.9 mL/min (A) (by C-G formula based on SCr of 0.4 mg/dL (L)). Liver Function Tests: Recent Labs  Lab 05/26/21 0926  AST 18  ALT 19  ALKPHOS 132*  BILITOT 0.8  PROT 6.8  ALBUMIN 2.6*   Recent Labs  Lab 05/26/21 0926  LIPASE 21   No results for input(s): AMMONIA in the last 168 hours. Coagulation Profile: Recent Labs  Lab 05/27/21 0644  INR 1.1   Cardiac Enzymes: No results for input(s): CKTOTAL, CKMB, CKMBINDEX, TROPONINI in the last 168 hours. BNP (last 3 results) No results for input(s): PROBNP in the last 8760 hours. HbA1C: No results for input(s): HGBA1C in the last 72 hours. CBG: No results for input(s): GLUCAP in the last 168 hours. Lipid Profile: No results for input(s): CHOL, HDL, LDLCALC, TRIG, CHOLHDL, LDLDIRECT in the last 72 hours. Thyroid  Function Tests: No results for input(s): TSH, T4TOTAL, FREET4, T3FREE, THYROIDAB in the last 72 hours. Anemia Panel: No results for input(s): VITAMINB12, FOLATE, FERRITIN, TIBC, IRON, RETICCTPCT in the last 72 hours. Sepsis Labs: Recent Labs  Lab 05/26/21 1000 05/26/21 1200 05/27/21 0644  PROCALCITON  --   --  0.46  LATICACIDVEN 1.8 1.9  --     Recent Results (from the past 240 hour(s))  Resp Panel by RT-PCR (Flu A&B, Covid) Nasopharyngeal Swab     Status: None   Collection Time: 05/26/21  9:40 AM   Specimen: Nasopharyngeal Swab; Nasopharyngeal(NP) swabs in vial transport medium  Result Value Ref Range Status   SARS Coronavirus 2 by RT PCR NEGATIVE NEGATIVE Final    Comment: (NOTE)  SARS-CoV-2 target nucleic acids are NOT DETECTED.  The SARS-CoV-2 RNA is generally detectable in upper respiratory specimens during the acute phase of infection. The lowest concentration of SARS-CoV-2 viral copies this assay can detect is 138 copies/mL. A negative result does not preclude SARS-Cov-2 infection and should not be used as the sole basis for treatment or other patient management decisions. A negative result may occur with  improper specimen collection/handling, submission of specimen other than nasopharyngeal swab, presence of viral mutation(s) within the areas targeted by this assay, and inadequate number of viral copies(<138 copies/mL). A negative result must be combined with clinical observations, patient history, and epidemiological information. The expected result is Negative.  Fact Sheet for Patients:  EntrepreneurPulse.com.au  Fact Sheet for Healthcare Providers:  IncredibleEmployment.be  This test is no t yet approved or cleared by the [place] FDA and  has been authorized for detection and/or diagnosis of SARS-CoV-2 by FDA under an Emergency Use Authorization (EUA). This EUA will remain  in effect (meaning this test can be used) for  the duration of the COVID-19 declaration under Section 564(b)(1) of the Act, 21 U.S.C.section 360bbb-3(b)(1), unless the authorization is terminated  or revoked sooner.       Influenza A by PCR NEGATIVE NEGATIVE Final   Influenza B by PCR NEGATIVE NEGATIVE Final    Comment: (NOTE) The Xpert Xpress SARS-CoV-2/FLU/RSV plus assay is intended as an aid in the diagnosis of influenza from Nasopharyngeal swab specimens and should not be used as a sole basis for treatment. Nasal washings and aspirates are unacceptable for Xpert Xpress SARS-CoV-2/FLU/RSV testing.  Fact Sheet for Patients: EntrepreneurPulse.com.au  Fact Sheet for Healthcare Providers: IncredibleEmployment.be  This test is not yet approved or cleared by the [place] FDA and has been authorized for detection and/or diagnosis of SARS-CoV-2 by FDA under an Emergency Use Authorization (EUA). This EUA will remain in effect (meaning this test can be used) for the duration of the COVID-19 declaration under Section 564(b)(1) of the Act, 21 U.S.C. section 360bbb-3(b)(1), unless the authorization is terminated or revoked.  Performed at [facility], [address] [place], [place] [address]   Blood culture (routine x 2)     Status: None   Collection Time: 05/26/21 10:08 AM   Specimen: BLOOD  Result Value Ref Range Status   Specimen Description BLOOD BLOOD RIGHT FOREARM  Final   Special Requests   Final    BOTTLES DRAWN AEROBIC AND ANAEROBIC Blood Culture results may not be optimal due to an excessive volume of blood received in culture bottles   Culture   Final    NO GROWTH 5 DAYS Performed at [facility], [address] [place], [place] [address]    Report Status 05/31/2021 FINAL  Final  Blood culture (routine x 2)     Status: None   Collection Time: 05/26/21 10:08 AM   Specimen: BLOOD  Result Value Ref Range Status   Specimen Description BLOOD BLOOD RIGHT  ARM  Final   Special Requests   Final    BOTTLES DRAWN AEROBIC AND ANAEROBIC Blood Culture adequate volume   Culture   Final    NO GROWTH 5 DAYS Performed at [facility], [address] [place], [place] [address]    Report Status 05/31/2021 FINAL  Final  Urine Culture     Status: None   Collection Time: 05/26/21 12:00 PM   Specimen: Urine, Random  Result Value Ref Range Status   Specimen Description  Final    URINE, RANDOM Performed at [facility], [address], [place], [place] [address]    Special Requests   Final    Normal Performed at [facility], [address] [place], [place] [address]    Culture   Final    NO GROWTH Performed at [facility], [place] [address], [place], [place] [address]    Report Status 05/28/2021 FINAL  Final  Aerobic/Anaerobic Culture (surgical/deep wound)     Status: None   Collection Time: 05/26/21  2:26 PM   Specimen: Abscess  Result Value Ref Range Status   Specimen Description ABSCESS RIGHT PLEURAL  Final   Special Requests SYRINGE  Final   Gram Stain   Final    ABUNDANT WBC PRESENT, PREDOMINANTLY PMN NO ORGANISMS SEEN    Culture   Final    FEW FUSOBACTERIUM NUCLEATUM BETA LACTAMASE NEGATIVE Performed at [facility], [place] [address] [place], [place] [address]    Report Status 05/30/2021 FINAL  Final  Body fluid culture w Gram Stain     Status: None   Collection Time: 05/27/21 11:24 AM   Specimen: Pleura; Body Fluid  Result Value Ref Range Status   Specimen Description   Final    PLEURAL Performed at [facility], [address] [place], [place] [address]    Special Requests   Final    PLEURAL Performed at [facility], [address] [place], [place] [address]    Gram Stain   Final    ABUNDANT WBC PRESENT,BOTH PMN AND MONONUCLEAR NO ORGANISMS SEEN    Culture   Final    NO GROWTH 3 DAYS Performed at [facility], [place] [address] [place],   [address]    Report Status 05/31/2021 FINAL  Final         Radiology Studies: DG Chest Port 1 View  Result Date: 05/30/2021 CLINICAL DATA:  55 year old female with right lung empyema. EXAM: PORTABLE CHEST 1 VIEW COMPARISON:  To below chest 05/28/2021.  CTA chest 05/26/2021. FINDINGS: Portable AP upright view at 0510 hours. Stable right pigtail pleural drain. Moderate residual patchy and veiling right lower lung opacity has only mildly improved since the prior CTA. No pneumothorax identified. Mediastinal contours remain within normal limits. Left lung remains negative. Visualized tracheal air column is within normal limits. No acute osseous abnormality identified. IMPRESSION: 1. Right pigtail pleural drain with mildly improved patchy and confluent right lower lung opacity related to empyema and atelectasis since the CTA on 05/26/2021. 2. No new cardiopulmonary abnormality. Electronically Signed   By: Genevie Ann M.D.   On: 05/30/2021 07:54        Scheduled Meds: . allopurinol  100 mg Oral Daily  . amLODipine  10 mg Oral Daily  . enoxaparin (LOVENOX) injection  40 mg Subcutaneous Q24H  . lidocaine  1 patch Transdermal Q24H  . methylPREDNISolone (SOLU-MEDROL) injection  40 mg Intravenous Daily  . nicotine  14 mg Transdermal Daily  . traZODone  50 mg Oral QHS   Continuous Infusions: . sodium chloride 250 mL (05/31/21 1009)  . ampicillin-sulbactam (UNASYN) IV 3 g (05/31/21 1010)     LOS: 5 days    Time spent: 15 minutes    Sidney Ace, MD [facility] Pager [phone]  If 7PM-7AM, please contact night-coverage 05/31/2021, 10:49 AM

## 2021-05-31 NOTE — Progress Notes (Signed)
Mobility Specialist - Progress Note   05/31/21 1239  Mobility  Activity Refused mobility  Mobility performed by Mobility specialist    Pt declined mobility, no reason specified. Pt had just finished ambulating with nursing staff at time of attempt. Will attempt session another date.    Tonya Johnson Mobility Specialist 05/31/21, 12:40 PM

## 2021-06-01 ENCOUNTER — Inpatient Hospital Stay: Payer: Self-pay

## 2021-06-01 ENCOUNTER — Encounter: Payer: Self-pay | Admitting: Internal Medicine

## 2021-06-01 LAB — BASIC METABOLIC PANEL
Anion gap: 9 (ref 5–15)
BUN: 16 mg/dL (ref 6–20)
CO2: 25 mmol/L (ref 22–32)
Calcium: 8.7 mg/dL — ABNORMAL LOW (ref 8.9–10.3)
Chloride: 104 mmol/L (ref 98–111)
Creatinine, Ser: 0.4 mg/dL — ABNORMAL LOW (ref 0.44–1.00)
GFR, Estimated: >60 mL/min (ref >60)
Glucose, Bld: 111 mg/dL — ABNORMAL HIGH (ref 70–99)
Potassium: 3.3 mmol/L — ABNORMAL LOW (ref 3.5–5.1)
Sodium: 138 mmol/L (ref 135–145)

## 2021-06-01 LAB — CBC WITH DIFFERENTIAL/PLATELET
Abs Immature Granulocytes: 0.52 10*3/uL — ABNORMAL HIGH (ref 0.00–0.07)
Basophils Absolute: 0 10*3/uL (ref 0.0–0.1)
Basophils Relative: 0 %
Eosinophils Absolute: 0 10*3/uL (ref 0.0–0.5)
Eosinophils Relative: 0 %
HCT: 35.3 % — ABNORMAL LOW (ref 36.0–46.0)
Hemoglobin: 12.1 g/dL (ref 12.0–15.0)
Immature Granulocytes: 3 %
Lymphocytes Relative: 15 %
Lymphs Abs: 2.6 10*3/uL (ref 0.7–4.0)
MCH: 33.5 pg (ref 26.0–34.0)
MCHC: 34.3 g/dL (ref 30.0–36.0)
MCV: 97.8 fL (ref 80.0–100.0)
Monocytes Absolute: 0.9 10*3/uL (ref 0.1–1.0)
Monocytes Relative: 6 %
Neutro Abs: 12.7 10*3/uL — ABNORMAL HIGH (ref 1.7–7.7)
Neutrophils Relative %: 76 %
Platelets: 505 10*3/uL — ABNORMAL HIGH (ref 150–400)
RBC: 3.61 MIL/uL — ABNORMAL LOW (ref 3.87–5.11)
RDW: 12.5 % (ref 11.5–15.5)
WBC: 16.8 10*3/uL — ABNORMAL HIGH (ref 4.0–10.5)
nRBC: 0 % (ref 0.0–0.2)

## 2021-06-01 MED ORDER — POTASSIUM CHLORIDE CRYS ER 20 MEQ PO TBCR
40.0000 meq | EXTENDED_RELEASE_TABLET | Freq: Once | ORAL | Status: AC
  Administered 2021-06-01: 40 meq via ORAL
  Filled 2021-06-01: qty 2

## 2021-06-01 NOTE — Progress Notes (Signed)
PROGRESS NOTE    Tonya Johnson  PXT:[id number] DOB: 08/12/1966 DOA: 05/26/2021 PCP: Lynnell Jude, MD  Brief Narrative:  55 year old female with history of hypertension and gout presents with a weeks worth of pain on her right side.  Presented to the hospital 5/30 and found to have an empyema.  Had chest tube placed by interventional radiology.  Pulmonology consulted and following for chest tube management. 6/1: Patient remains hemodynamically stable.  Small amount of purulent fluid, 60 cc liberated upon placement of tube.  Follow-up chest x-ray shows slightly better size of effusion. 6/3: Follow-up chest x-ray demonstrates slight increase in size of right-sided pleural effusion.  Chest tube remains in place.  Patient feels well overall 6/4: WBC decreasing.  Patient feels well overall 6/5: WBC continues to decrease.  Follow-up chest x-ray this morning shows interval improvement in size of effusion   Assessment & Plan:   Principal Problem:   Empyema, right (HCC) Active Problems:   Acute lower UTI   Essential hypertension   Gout   Nicotine dependence   Hypokalemia   Sepsis ([place])   Pleuritic chest pain   Acute cystitis with hematuria   Diarrhea   Insomnia  Right side empyema Sepsis secondary to above Acute hypoxic respiratory failure secondary to above Sepsis diagnoses met with tachycardia, tachypnea, leukocytosis Chest tube to remain in place 6/3: Patient feels well.  Follow-up chest x-ray slight interval decrease in size of right pleural effusion Fluid culture initially with Fusobacterium, now showing no growth Suspect anaerobes 6/5: Interval improvement in the right pleural effusion per portable chest x-ray with Plan: Continue chest tube Continue Unasyn per pharmacy recommendations Pulmonary follow-up for chest tube management Daily labs Vitals per unit protocol Follow fever curve Patient has been stable on room air with only slight desaturation 88% at night.  Oxygen  as necessary Possible discharge in 24 to 48 hours  Pleuritic type chest pain Appears improved after addition of Solu-Medrol, will continue  Acute cystitis associated with hematuria On antibiotics for empyema Follow urine culture, no growth  Acute diarrhea Resolved after discontinuation of senna  Insomnia Nightly trazodone  History of gout Continue allopurinol  History of hypertension Improved control over interval Home Norvasc 10 mg a day ordered    DVT prophylaxis: SQ Lovenox Code Status: Full code Family Communication: Family member at bedside 6/1, 6/2, 6/4, 6/5 Disposition Plan: Status is: Inpatient  Remains inpatient appropriate because:Inpatient level of care appropriate due to severity of illness   Dispo: The patient is from: Home              Anticipated d/c is to: Home              Patient currently is not medically stable to d/c.   Difficult to place patient No  Empyema.  Chest tube in place.  No interval improvement.  Pulmonary following for chest tube management.     Level of care: Med-Surg  Consultants:   Pulmonology  Procedures:  Chest tube  Antimicrobials:    Unasyn   Subjective: Patient seen and examined.  Chest x-ray with interval improvement.  Patient feels well  Objective: Vitals:   05/31/21 1617 05/31/21 1953 06/01/21 0347 06/01/21 0851  BP: 130/79 136/75 (!) 159/85 (!) 154/78  Pulse: 64 64 (!) 57 66  Resp: _0 Temp: 98.8 F (37.1 C) 98.6 F (37 C) 98.1 F (36.7 C) 98.9 F (37.2 C)  TempSrc: Oral Oral Oral Oral  SpO2: 94% 95%  98% 97%  Weight:      Height:        Intake/Output Summary (Last 24 hours) at 06/01/2021 1009 Last data filed at 06/01/2021 0431 Gross per 24 hour  Intake 483.88 ml  Output 50 ml  Net 433.88 ml   Filed Weights   05/26/21 0923  Weight: 68 kg    Examination:  General exam: Appears calm and comfortable  Respiratory system: Decreased breath sounds on the right.  Chest tube in place.   Normal work of breathing.  Room air Cardiovascular system: S1 & S2 heard, RRR. No JVD, murmurs, rubs, gallops or clicks. No pedal edema. Gastrointestinal system: Abdomen is nondistended, soft and nontender. No organomegaly or masses felt. Normal bowel sounds heard. Central nervous system: Alert and oriented. No focal neurological deficits. Extremities: Symmetric 5 x 5 power. Skin: No rashes, lesions or ulcers Psychiatry: Judgement and insight appear normal. Mood & affect appropriate.     Data Reviewed: I have personally reviewed following labs and imaging studies  CBC: Recent Labs  Lab 05/28/21 0552 05/29/21 0728 05/30/21 0538 05/31/21 0417 06/01/21 0430  WBC 21.1* 19.4* 19.6* 18.0* 16.8*  NEUTROABS 17.8* 15.2* 15.1* 13.3* 12.7*  HGB 11.1* 12.0 12.7 12.5 12.1  HCT 32.8* 35.5* 36.9 36.4 35.3*  MCV 97.6 98.6 97.6 97.8 97.8  PLT 398 489* 516* 500* 694*   Basic Metabolic Panel: Recent Labs  Lab 05/26/21 0935 05/27/21 0644 05/28/21 0552 05/29/21 0728 05/30/21 0538 05/31/21 0417 06/01/21 0430  NA  --    < > 139 138 137 138 138  K  --    < > 2.8* 3.1* 3.2* 3.7 3.3*  CL  --    < > 99 101 102 103 104  CO2  --    < > _0 GLUCOSE  --    < > 124* 110* 102* 101* 111*  BUN  --    < > _1 CREATININE  --    < > 0.48 0.50 0.52 0.40* 0.40*  CALCIUM  --    < > 8.8* 8.9 8.8* 8.9 8.7*  MG 2.0  --   --   --   --   --   --    < > = values in this interval not displayed.   GFR: Estimated Creatinine Clearance: 70.9 mL/min (A) (by C-G formula based on SCr of 0.4 mg/dL (L)). Liver Function Tests: Recent Labs  Lab 05/26/21 0926  AST 18  ALT 19  ALKPHOS 132*  BILITOT 0.8  PROT 6.8  ALBUMIN 2.6*   Recent Labs  Lab 05/26/21 0926  LIPASE 21   No results for input(s): AMMONIA in the last 168 hours. Coagulation Profile: Recent Labs  Lab 05/27/21 0644  INR 1.1   Cardiac Enzymes: No results for input(s): CKTOTAL, CKMB, CKMBINDEX, TROPONINI in the last 168  hours. BNP (last 3 results) No results for input(s): PROBNP in the last 8760 hours. HbA1C: No results for input(s): HGBA1C in the last 72 hours. CBG: No results for input(s): GLUCAP in the last 168 hours. Lipid Profile: No results for input(s): CHOL, HDL, LDLCALC, TRIG, CHOLHDL, LDLDIRECT in the last 72 hours. Thyroid Function Tests: No results for input(s): TSH, T4TOTAL, FREET4, T3FREE, THYROIDAB in the last 72 hours. Anemia Panel: No results for input(s): VITAMINB12, FOLATE, FERRITIN, TIBC, IRON, RETICCTPCT in the last 72 hours. Sepsis Labs: Recent Labs  Lab 05/26/21 1000 05/26/21 1200 05/27/21 0644  PROCALCITON  --   --  0.46  LATICACIDVEN 1.8 1.9  --     Recent Results (from the past 240 hour(s))  Resp Panel by RT-PCR (Flu A&B, Covid) Nasopharyngeal Swab     Status: None   Collection Time: 05/26/21  9:40 AM   Specimen: Nasopharyngeal Swab; Nasopharyngeal(NP) swabs in vial transport medium  Result Value Ref Range Status   SARS Coronavirus 2 by RT PCR NEGATIVE NEGATIVE Final    Comment: (NOTE) SARS-CoV-2 target nucleic acids are NOT DETECTED.  The SARS-CoV-2 RNA is generally detectable in upper respiratory specimens during the acute phase of infection. The lowest concentration of SARS-CoV-2 viral copies this assay can detect is 138 copies/mL. A negative result does not preclude SARS-Cov-2 infection and should not be used as the sole basis for treatment or other patient management decisions. A negative result may occur with  improper specimen collection/handling, submission of specimen other than nasopharyngeal swab, presence of viral mutation(s) within the areas targeted by this assay, and inadequate number of viral copies(<138 copies/mL). A negative result must be combined with clinical observations, patient history, and epidemiological information. The expected result is Negative.  Fact Sheet for Patients:  EntrepreneurPulse.com.au  Fact Sheet for  Healthcare Providers:  IncredibleEmployment.be  This test is no t yet approved or cleared by the [place] FDA and  has been authorized for detection and/or diagnosis of SARS-CoV-2 by FDA under an Emergency Use Authorization (EUA). This EUA will remain  in effect (meaning this test can be used) for the duration of the COVID-19 declaration under Section 564(b)(1) of the Act, 21 U.S.C.section 360bbb-3(b)(1), unless the authorization is terminated  or revoked sooner.       Influenza A by PCR NEGATIVE NEGATIVE Final   Influenza B by PCR NEGATIVE NEGATIVE Final    Comment: (NOTE) The Xpert Xpress SARS-CoV-2/FLU/RSV plus assay is intended as an aid in the diagnosis of influenza from Nasopharyngeal swab specimens and should not be used as a sole basis for treatment. Nasal washings and aspirates are unacceptable for Xpert Xpress SARS-CoV-2/FLU/RSV testing.  Fact Sheet for Patients: EntrepreneurPulse.com.au  Fact Sheet for Healthcare Providers: IncredibleEmployment.be  This test is not yet approved or cleared by the [place] FDA and has been authorized for detection and/or diagnosis of SARS-CoV-2 by FDA under an Emergency Use Authorization (EUA). This EUA will remain in effect (meaning this test can be used) for the duration of the COVID-19 declaration under Section 564(b)(1) of the Act, 21 U.S.C. section 360bbb-3(b)(1), unless the authorization is terminated or revoked.  Performed at [facility], [address] [place], [place] [address]   Blood culture (routine x 2)     Status: None   Collection Time: 05/26/21 10:08 AM   Specimen: BLOOD  Result Value Ref Range Status   Specimen Description BLOOD BLOOD RIGHT FOREARM  Final   Special Requests   Final    BOTTLES DRAWN AEROBIC AND ANAEROBIC Blood Culture results may not be optimal due to an excessive volume of blood received in culture bottles   Culture    Final    NO GROWTH 5 DAYS Performed at [facility], [address] [place], [place] [address]    Report Status 05/31/2021 FINAL  Final  Blood culture (routine x 2)     Status: None   Collection Time: 05/26/21 10:08 AM   Specimen: BLOOD  Result Value Ref Range Status   Specimen Description BLOOD BLOOD RIGHT ARM  Final   Special Requests   Final    BOTTLES  DRAWN AEROBIC AND ANAEROBIC Blood Culture adequate volume   Culture   Final    NO GROWTH 5 DAYS Performed at [facility], [address] [place], [place] [address]    Report Status 05/31/2021 FINAL  Final  Urine Culture     Status: None   Collection Time: 05/26/21 12:00 PM   Specimen: Urine, Random  Result Value Ref Range Status   Specimen Description   Final    URINE, RANDOM Performed at [facility], [address] [place], [place] [address]    Special Requests   Final    Normal Performed at [facility], [address], [place], [place] [address]    Culture   Final    NO GROWTH Performed at [facility], [place] [address] [place], [place] [address]    Report Status 05/28/2021 FINAL  Final  Aerobic/Anaerobic Culture (surgical/deep wound)     Status: None   Collection Time: 05/26/21  2:26 PM   Specimen: Abscess  Result Value Ref Range Status   Specimen Description ABSCESS RIGHT PLEURAL  Final   Special Requests SYRINGE  Final   Gram Stain   Final    ABUNDANT WBC PRESENT, PREDOMINANTLY PMN NO ORGANISMS SEEN    Culture   Final    FEW FUSOBACTERIUM NUCLEATUM BETA LACTAMASE NEGATIVE Performed at [facility], [place] [address], [place], [place] [address]    Report Status 05/30/2021 FINAL  Final  Body fluid culture w Gram Stain     Status: None   Collection Time: 05/27/21 11:24 AM   Specimen: Pleura; Body Fluid  Result Value Ref Range Status   Specimen Description   Final    PLEURAL Performed at [facility], [address] [place], [place] [address]     Special Requests   Final    PLEURAL Performed at [facility], [address] [place], [place] [address]    Gram Stain   Final    ABUNDANT WBC PRESENT,BOTH PMN AND MONONUCLEAR NO ORGANISMS SEEN    Culture   Final    NO GROWTH 3 DAYS Performed at [facility], [place] [address], [place], [place] [address]    Report Status 05/31/2021 FINAL  Final         Radiology Studies: No results found.      Scheduled Meds: . allopurinol  100 mg Oral Daily  . amLODipine  10 mg Oral Daily  . enoxaparin (LOVENOX) injection  40 mg Subcutaneous Q24H  . lidocaine  1 patch Transdermal Q24H  . methylPREDNISolone (SOLU-MEDROL) injection  40 mg Intravenous Daily  . nicotine  14 mg Transdermal Daily  . traZODone  50 mg Oral QHS   Continuous Infusions: . sodium chloride Stopped (06/01/21 0031)  . ampicillin-sulbactam (UNASYN) IV 3 g (06/01/21 0958)     LOS: 6 days    Time spent: 15 minutes    Sidney Ace, MD [facility] Pager [phone]  If 7PM-7AM, please contact night-coverage 06/01/2021, 10:09 AM

## 2021-06-01 NOTE — Progress Notes (Signed)
Pulmonary Medicine     HISTORY OF PRESENT ILLNESS   She is feeling better each day, on puerly anearobic coverage. No new copmplaints. No fever wbc decreasing and plural slowly resolving.  Component 6 d ago  Specimen Description ABSCESS RIGHT PLEURAL   Special Requests SYRINGE   Gram Stain ABUNDANT WBC PRESENT, PREDOMINANTLY PMN  NO ORGANISMS SEEN   Culture FEW FUSOBACTERIUM NUCLEATUM  BETA LACTAMASE NEGATIVE  Performed at [facility], [address] [place], [place] [address]   Report Status 05/30/2021 FINAL   Resulting Agency [facility]            Component 5 d ago  Specimen Description PLEURAL  Performed at [facility], [address] [place], [place] [address]   Special Requests PLEURAL  Performed at [facility], [address] [place], [place] [address]   Gram Stain ABUNDANT WBC PRESENT,BOTH PMN AND MONONUCLEAR  NO ORGANISMS SEEN   Culture NO GROWTH 3 DAYS  Performed at [facility], [address] [place], [place] [address]   Report Status 05/31/2021 FINAL   Resulting Agency [facility]     PAST MEDICAL HISTORY   Past Medical History:  Diagnosis Date  . Hypertension      SURGICAL HISTORY   History reviewed. No pertinent surgical history.   FAMILY HISTORY   Family History  Problem Relation Age of Onset  . Hypertension Mother   . Prostate cancer Father      SOCIAL HISTORY   Social History   Tobacco Use  . Smoking status: Current Every Day Smoker    Packs/day: 0.50    Types: Cigarettes  . Smokeless tobacco: Never Used     MEDICATIONS    Home Medication:    Current Medication:  Current Facility-Administered Medications:  .  0.9 %  sodium chloride infusion, , Intravenous, PRN, Tresa MooreSreenath, Sudheer B, MD, Stopped at 06/01/21 0031 .  allopurinol (ZYLOPRIM) tablet 100 mg, 100 mg, Oral, Daily, Renae GlossWieting, Richard, MD, 100 mg at 06/01/21 0953 .  amLODipine (NORVASC) tablet 10 mg, 10 mg, Oral, Daily,  Sreenath, Sudheer B, MD, 10 mg at 06/01/21 0953 .  Ampicillin-Sulbactam (UNASYN) 3 g in sodium chloride 0.9 % 100 mL IVPB, 3 g, Intravenous, Q6H, Sreenath, Sudheer B, MD, Last Rate: 200 mL/hr at 06/01/21 1606, 3 g at 06/01/21 1606 .  enoxaparin (LOVENOX) injection 40 mg, 40 mg, Subcutaneous, Q24H, Agbata, Tochukwu, MD, 40 mg at 05/31/21 2154 .  lidocaine (LIDODERM) 5 % 1 patch, 1 patch, Transdermal, Q24H, Concha SeFunke, Mary E, MD, 1 patch at 06/01/21 [phone] .  methocarbamol (ROBAXIN) tablet 500 mg, 500 mg, Oral, Q6H PRN, Sreenath, Sudheer B, MD .  methylPREDNISolone sodium succinate (SOLU-MEDROL) 40 mg/mL injection 40 mg, 40 mg, Intravenous, Daily, Renae GlossWieting, Richard, MD, 40 mg at 06/01/21 0951 .  nicotine (NICODERM CQ - dosed in mg/24 hours) patch 14 mg, 14 mg, Transdermal, Daily, Agbata, Tochukwu, MD, 14 mg at 06/01/21 0952 .  ondansetron (ZOFRAN) tablet 4 mg, 4 mg, Oral, Q6H PRN **OR** ondansetron (ZOFRAN) injection 4 mg, 4 mg, Intravenous, Q6H PRN, Agbata, Tochukwu, MD .  oxyCODONE (Oxy IR/ROXICODONE) immediate release tablet 5 mg, 5 mg, Oral, Q4H PRN, Georgeann OppenheimSreenath, Sudheer B, MD, 5 mg at 06/01/21 1603 .  traZODone (DESYREL) tablet 50 mg, 50 mg, Oral, QHS, Wieting, Richard, MD, 50 mg at 05/31/21 2154    ALLERGIES   Patient has no known allergies.     REVIEW OF SYSTEMS  Review of Systems:  Gen:  Denies  fever, sweats, chills weigh loss  HEENT: Denies blurred vision, double vision, ear pain, eye pain, hearing loss, nose bleeds, sore throat Cardiac:  No dizziness, chest pain or heaviness, chest tightness,edema Resp:   Rare ough or sputum porduction, less shortness of breath,wheezing, hemoptysis,  Gi: Denies swallowing difficulty, stomach pain, nausea or vomiting, diarrhea, constipation, bowel incontinence Gu:  Denies bladder incontinence, burning urine Ext:   Denies Joint pain, stiffness or swelling Skin: Denies  skin rash, easy bruising or bleeding or hives Endoc:  Denies polyuria, polydipsia ,  polyphagia or weight change Psych:   Denies depression, insomnia or hallucinations   Other:  All other systems negative   VS: BP 122/77 (BP Location: Right Arm)   Pulse 64   Temp 98.7 F (37.1 C) (Oral)   Resp 20   Ht 5\' 1"  (1.549 m)   Wt 68 kg   SpO2 96%   BMI 28.34 kg/m      PHYSICAL EXAM    GENERAL:NAD, no fevers, chills, no weakness no fatigue HEAD: Normocephalic, atraumatic.  EYES: Pupils equal, round, reactive to light. Extraocular muscles intact. No scleral icterus.  MOUTH: Moist mucosal membrane. Dentition intact. No abscess noted.  EAR, NOSE, THROAT: Clear without exudates. No external lesions.  NECK: Supple. No thyromegaly. No nodules. No JVD.  PULMONARY: no rub, dullness right lower chest, right btube still in pace.  CARDIOVASCULAR: S1 and S2. Regular rate and rhythm. No murmurs, rubs, or gallops. No edema. Pedal pulses 2+ bilaterally.  GASTROINTESTINAL: Soft, nontender, nondistended. No masses. Positive bowel sounds. No hepatosplenomegaly.  MUSCULOSKELETAL: No swelling, clubbing, or edema. Range of motion full in all extremities.  NEUROLOGIC: Cranial nerves II through XII are intact. No gross focal neurological deficits. Sensation intact. Reflexes intact.  SKIN: No ulceration, lesions, rashes, or cyanosis. Skin warm and dry. Turgor intact.  PSYCHIATRIC: Mood, affect within normal limits. The patient is awake, alert and oriented x 3. Insight, judgment intact.       IMAGING    DG Chest 2 View  Result Date: 05/26/2021 CLINICAL DATA:  Shortness of breath for 1 week EXAM: CHEST - 2 VIEW COMPARISON:  None. FINDINGS: Sizable right pleural effusion with air-fluid level laterally. The underlying lung is obscured. Clear left lung. Normal heart size. IMPRESSION: Large right pleural effusion with air-fluid level, question symptoms of empyema. Recommend chest CT. Electronically Signed   By: 05/28/2021 M.D.   On: 05/26/2021 10:11   CT Angio Chest PE W and/or Wo  Contrast  Result Date: 05/26/2021 CLINICAL DATA:  Short of breath. Patient with right flank pain for 1 week. Current chest radiograph demonstrated right mid to lower lung zone opacity with a right mid lung air-fluid level. EXAM: CT ANGIOGRAPHY CHEST WITH CONTRAST TECHNIQUE: Multidetector CT imaging of the chest was performed using the standard protocol during bolus administration of intravenous contrast. Multiplanar CT image reconstructions and MIPs were obtained to evaluate the vascular anatomy. CONTRAST:  52mL OMNIPAQUE IOHEXOL 350 MG/ML SOLN COMPARISON:  Current chest radiograph. FINDINGS: Cardiovascular: Pulmonary arteries are well opacified. There is no evidence of a pulmonary embolism. Heart is normal in size and configuration. Trace amount of pericardial fluid. Great vessels are normal in caliber. No aortic dissection or significant atherosclerosis. Arch branch vessels are widely patent. Mediastinum/Nodes: 1.3 cm posterior right thyroid nodule. No follow-up recommended. No neck base or axillary masses or enlarged lymph nodes. No mediastinal or left hilar masses or enlarged lymph nodes. Generalized increased  soft tissue along the right hilum suggests mild confluent adenopathy. Trachea and esophagus are unremarkable. Lungs/Pleura: Loculated right pleural effusion in the mid to lower right hemithorax. The lateral portion of this contains an air-fluid level corresponding to the air-fluid level noted on the current chest radiograph. The overlying visceral pleura appears mildly thickened and irregular. Most of the right lower lobe is collapsed/atelectatic. There is milder compressive atelectasis in the right middle lobe and a portion of the lateral right upper lobe adjacent to the pleural effusion. Remainder of the right lung is clear. Left lung is clear. No left pleural effusion. No pneumothorax. Bronchi appear patent.  No evidence of a centrally obstructing mass. Upper Abdomen: No acute findings. 1.6 cm left  adrenal nodule, nonspecific. Musculoskeletal: No fracture or acute finding.  No bone lesion. Review of the MIP images confirms the above findings. IMPRESSION: 1. No evidence of a pulmonary embolism. 2. Loculated right pleural effusion containing an air-fluid level. Findings are suspicious for an empyema. There is associated atelectasis predominantly of the right lower lobe, to a lesser degree of the right middle lobe and minimally of the right upper lobe. Electronically Signed   By: Amie Portland M.D.   On: 05/26/2021 10:59   DG Chest Port 1 View  Result Date: 06/01/2021 CLINICAL DATA:  Empyema. EXAM: PORTABLE CHEST 1 VIEW COMPARISON:  05/30/2021 FINDINGS: Normal cardiac silhouette. RIGHT chest tube in place with no pneumothorax. Low lung volumes in the RIGHT hemithorax with small RIGHT effusion. Effusion slightly improved. RIGHT basilar atelectasis. LEFT lung clear. IMPRESSION: 1. No interval change. 2. Chest tube in place on the RIGHT with atelectasis and persistent RIGHT basilar effusion. Effusion slightly improved. Electronically Signed   By: Genevive Bi M.D.   On: 06/01/2021 07:58   DG Chest Port 1 View  Result Date: 05/30/2021 CLINICAL DATA:  55 year old female with right lung empyema. EXAM: PORTABLE CHEST 1 VIEW COMPARISON:  To below chest 05/28/2021.  CTA chest 05/26/2021. FINDINGS: Portable AP upright view at 0510 hours. Stable right pigtail pleural drain. Moderate residual patchy and veiling right lower lung opacity has only mildly improved since the prior CTA. No pneumothorax identified. Mediastinal contours remain within normal limits. Left lung remains negative. Visualized tracheal air column is within normal limits. No acute osseous abnormality identified. IMPRESSION: 1. Right pigtail pleural drain with mildly improved patchy and confluent right lower lung opacity related to empyema and atelectasis since the CTA on 05/26/2021. 2. No new cardiopulmonary abnormality. Electronically Signed    By: Odessa Akhil Piscopo M.D.   On: 05/30/2021 07:54   DG Chest Port 1 View  Result Date: 05/28/2021 CLINICAL DATA:  Syncope EXAM: PORTABLE CHEST 1 VIEW COMPARISON:  Chest x-ray 05/26/2021, CT chest 05/26/2021 FINDINGS: Interval placement of right lower pigtail drainage catheter. Slight decreased loculated right pleural effusion. Mild cardiomegaly with increased vascular congestion and probable mild edema. Aortic atherosclerosis. Persistent airspace disease at the right middle lobe and right base IMPRESSION: 1. Interval placement of right lower chest drainage catheter with slight decreased loculated right pleural collection 2. Mild cardiomegaly with interim vascular congestion and mild pulmonary edema 3. Consolidation at the right middle lobe and right base Electronically Signed   By: Jasmine Pang M.D.   On: 05/28/2021 00:32   CT IMAGE GUIDED DRAINAGE BY PERCUTANEOUS CATHETER  Result Date: 05/26/2021 INDICATION: 55 year old female with large right pleural effusion, suspicious for empyema. EXAM: CT IMAGE GUIDED DRAINAGE BY PERCUTANEOUS CATHETER COMPARISON:  CT chest from earlier the same day MEDICATIONS: The  patient is currently admitted to the hospital and receiving intravenous antibiotics. The antibiotics were administered within an appropriate time frame prior to the initiation of the procedure. ANESTHESIA/SEDATION: Moderate (conscious) sedation was employed during this procedure. A total of Versed 1 mg and Fentanyl 50 mcg was administered intravenously. Moderate Sedation Time: 15 minutes. The patient's level of consciousness and vital signs were monitored continuously by radiology nursing throughout the procedure under my direct supervision. CONTRAST:  None COMPLICATIONS: None immediate. PROCEDURE: Informed written consent was obtained from the patient after a discussion of the risks, benefits and alternatives to treatment. The patient was placed supine, partially left lateral decubitus on the CT gantry and a pre  procedural CT was performed re-demonstrating the known fluid collection within the right pleural space. The procedure was planned. A timeout was performed prior to the initiation of the procedure. The right posterolateral and inferior thorax was prepped and draped in the usual sterile fashion. The overlying soft tissues were anesthetized with 1% lidocaine with epinephrine. Appropriate trajectory was planned with the use of a 22 gauge spinal needle. An 18 gauge trocar needle was advanced into the pleural space and a short Amplatz super stiff wire was coiled within the collection. Appropriate positioning was confirmed with a limited CT scan. The tract was serially dilated allowing placement of a 14 French all-purpose drainage catheter. Appropriate positioning was confirmed with a limited postprocedural CT scan. A total of approximately 60 ml of opaque yellow, purulence fluid was aspirated. Samples were sent to the lab for fluid analysis. The tube was connected to a pleurovac and sutured in place. A dressing was placed. The patient tolerated the procedure well without immediate post procedural complication. IMPRESSION: Successful CT guided placement of a 30 French all purpose drain catheter into the right empyema with aspiration of approximately 60 mL of purulence fluid. Samples were sent to the laboratory as requested by the ordering clinical team. Marliss Coots, MD [facility] Electronically Signed   By: Marliss Coots MD   On: 05/26/2021 14:44      ASSESSMENT/PLAN   Her presentation is c/w with empyema ( low glucose, super hig ldh, high wbc and pus looking), drain in place. On anearobic coverage.Pleural effusion slowly decreasing.  -d/c possible when chest tunbe is draining less than 70 cc a day - 4- 6 weeks of augmentin  - out patient serial cxr's  - stay of cigarettes  - combivent or the equivalent is resonable - out patient pfts post empyema  clearance    Thank you for allowing me to participate in the care of this patient.   Patient/Family are satisfied with care plan and all questions have been answered.  This document was prepared using Dragon voice recognition software and may include unintentional dictation errors.     Ned Clines, M.D.  Division of Pulmonary & Critical Care Medicine  [facility]

## 2021-06-02 LAB — CBC WITH DIFFERENTIAL/PLATELET
Abs Immature Granulocytes: 0.38 10*3/uL — ABNORMAL HIGH (ref 0.00–0.07)
Basophils Absolute: 0 10*3/uL (ref 0.0–0.1)
Basophils Relative: 0 %
Eosinophils Absolute: 0 10*3/uL (ref 0.0–0.5)
Eosinophils Relative: 0 %
HCT: 38.1 % (ref 36.0–46.0)
Hemoglobin: 12.9 g/dL (ref 12.0–15.0)
Immature Granulocytes: 3 %
Lymphocytes Relative: 20 %
Lymphs Abs: 2.9 10*3/uL (ref 0.7–4.0)
MCH: 33 pg (ref 26.0–34.0)
MCHC: 33.9 g/dL (ref 30.0–36.0)
MCV: 97.4 fL (ref 80.0–100.0)
Monocytes Absolute: 0.9 10*3/uL (ref 0.1–1.0)
Monocytes Relative: 6 %
Neutro Abs: 10.1 10*3/uL — ABNORMAL HIGH (ref 1.7–7.7)
Neutrophils Relative %: 71 %
Platelets: 498 10*3/uL — ABNORMAL HIGH (ref 150–400)
RBC: 3.91 MIL/uL (ref 3.87–5.11)
RDW: 12.3 % (ref 11.5–15.5)
WBC: 14.3 10*3/uL — ABNORMAL HIGH (ref 4.0–10.5)
nRBC: 0 % (ref 0.0–0.2)

## 2021-06-02 LAB — BASIC METABOLIC PANEL
Anion gap: 9 (ref 5–15)
BUN: 16 mg/dL (ref 6–20)
CO2: 25 mmol/L (ref 22–32)
Calcium: 9.1 mg/dL (ref 8.9–10.3)
Chloride: 103 mmol/L (ref 98–111)
Creatinine, Ser: 0.4 mg/dL — ABNORMAL LOW (ref 0.44–1.00)
GFR, Estimated: >60 mL/min (ref >60)
Glucose, Bld: 106 mg/dL — ABNORMAL HIGH (ref 70–99)
Potassium: 3.8 mmol/L (ref 3.5–5.1)
Sodium: 137 mmol/L (ref 135–145)

## 2021-06-02 NOTE — Progress Notes (Signed)
PROGRESS NOTE    Tonya Johnson  MRN:[id number] DOB: 09/27/1966 DOA: 05/26/2021 PCP: Bliss, Laura K, MD  Brief Narrative:  55-year-old female with history of hypertension and gout presents with a weeks worth of pain on her right side.  Presented to the hospital 5/30 and found to have an empyema.  Had chest tube placed by interventional radiology.  Pulmonology consulted and following for chest tube management. 6/1: Patient remains hemodynamically stable.  Small amount of purulent fluid, 60 cc liberated upon placement of tube.  Follow-up chest x-ray shows slightly better size of effusion. 6/3: Follow-up chest x-ray demonstrates slight increase in size of right-sided pleural effusion.  Chest tube remains in place.  Patient feels well overall 6/4: WBC decreasing.  Patient feels well overall 6/5: WBC continues to decrease.  Follow-up chest x-ray this morning shows interval improvement in size of effusion   Assessment & Plan:   Principal Problem:   Empyema, right (HCC) Active Problems:   Acute lower UTI   Essential hypertension   Gout   Nicotine dependence   Hypokalemia   Sepsis (HCC)   Pleuritic chest pain   Acute cystitis with hematuria   Diarrhea   Insomnia  Right side empyema Sepsis secondary to above Acute hypoxic respiratory failure secondary to above Sepsis diagnoses met with tachycardia, tachypnea, leukocytosis Chest tube to remain in place 6/3: Patient feels well.  Follow-up chest x-ray slight interval decrease in size of right pleural effusion Fluid culture initially with Fusobacterium, now showing no growth Suspect anaerobes 6/5: Interval improvement in the right pleural effusion per portable chest x-ray with 6/6: 24-hour chest tube output 50 cc Plan: Continue chest tube for today Continue Unasyn per pharmacy recommendations We will repeat chest x-ray in a.m. If chest tube output remains minimal and leukocytosis resolving and chest x-ray shows improvement will  consider discontinuation of chest tube and discharge home on 6/7  Pleuritic type chest pain Appears improved after addition of Solu-Medrol, will continue  Acute cystitis associated with hematuria On antibiotics for empyema Follow urine culture, no growth  Acute diarrhea Resolved after discontinuation of senna  Insomnia Nightly trazodone  History of gout Continue allopurinol  History of hypertension Improved control over interval Home Norvasc 10 mg a day ordered    DVT prophylaxis: SQ Lovenox Code Status: Full code Family Communication: Family member at bedside 6/1, 6/2, 6/4, 6/5 Disposition Plan: Status is: Inpatient  Remains inpatient appropriate because:Inpatient level of care appropriate due to severity of illness   Dispo: The patient is from: Home              Anticipated d/c is to: Home              Patient currently is not medically stable to d/c.   Difficult to place patient No  Empyema.  Chest tube remains in place.  Interval improvement demonstrated.  Anticipated discontinuation of chest tube on 6/7 and discharged home.     Level of care: Med-Surg  Consultants:   Pulmonology  Procedures:  Chest tube  Antimicrobials:    Unasyn   Subjective: Patient seen and examined.  Chest x-ray not done this morning but patient clinically improved.  No complaints  Objective: Vitals:   06/01/21 1601 06/01/21 2005 06/02/21 0424 06/02/21 0844  BP: 122/77 134/84 (!) 150/97 140/75  Pulse: 64 62 60 66  Resp: 20 16 16 18  Temp: 98.7 F (37.1 C) 98.8 F (37.1 C) 98.3 F (36.8 C) 98.1 F (36.7 C)  TempSrc:   Oral Oral  Oral  SpO2: 96% 95% 96% 94%  Weight:      Height:        Intake/Output Summary (Last 24 hours) at 06/02/2021 1052 Last data filed at 06/02/2021 1017 Gross per 24 hour  Intake 390 ml  Output 50 ml  Net 340 ml   Filed Weights   05/26/21 0923  Weight: 68 kg    Examination:  General exam: Appears calm and comfortable  Respiratory  system: Decreased breath sounds on the right.  Chest tube in place.  Normal work of breathing.  Room air Cardiovascular system: S1 & S2 heard, RRR. No JVD, murmurs, rubs, gallops or clicks. No pedal edema. Gastrointestinal system: Abdomen is nondistended, soft and nontender. No organomegaly or masses felt. Normal bowel sounds heard. Central nervous system: Alert and oriented. No focal neurological deficits. Extremities: Symmetric 5 x 5 power. Skin: No rashes, lesions or ulcers Psychiatry: Judgement and insight appear normal. Mood & affect appropriate.     Data Reviewed: I have personally reviewed following labs and imaging studies  CBC: Recent Labs  Lab 05/29/21 0728 05/30/21 0538 05/31/21 0417 06/01/21 0430 06/02/21 0503  WBC 19.4* 19.6* 18.0* 16.8* 14.3*  NEUTROABS 15.2* 15.1* 13.3* 12.7* 10.1*  HGB 12.0 12.7 12.5 12.1 12.9  HCT 35.5* 36.9 36.4 35.3* 38.1  MCV 98.6 97.6 97.8 97.8 97.4  PLT 489* 516* 500* 505* 498*   Basic Metabolic Panel: Recent Labs  Lab 05/29/21 0728 05/30/21 0538 05/31/21 0417 06/01/21 0430 06/02/21 0503  NA 138 137 138 138 137  K 3.1* 3.2* 3.7 3.3* 3.8  CL 101 102 103 104 103  CO2 27 25 26 25 25  GLUCOSE 110* 102* 101* 111* 106*  BUN 14 17 18 16 16  CREATININE 0.50 0.52 0.40* 0.40* 0.40*  CALCIUM 8.9 8.8* 8.9 8.7* 9.1   GFR: Estimated Creatinine Clearance: 70.9 mL/min (A) (by C-G formula based on SCr of 0.4 mg/dL (L)). Liver Function Tests: No results for input(s): AST, ALT, ALKPHOS, BILITOT, PROT, ALBUMIN in the last 168 hours. No results for input(s): LIPASE, AMYLASE in the last 168 hours. No results for input(s): AMMONIA in the last 168 hours. Coagulation Profile: Recent Labs  Lab 05/27/21 0644  INR 1.1   Cardiac Enzymes: No results for input(s): CKTOTAL, CKMB, CKMBINDEX, TROPONINI in the last 168 hours. BNP (last 3 results) No results for input(s): PROBNP in the last 8760 hours. HbA1C: No results for input(s): HGBA1C in the last  72 hours. CBG: No results for input(s): GLUCAP in the last 168 hours. Lipid Profile: No results for input(s): CHOL, HDL, LDLCALC, TRIG, CHOLHDL, LDLDIRECT in the last 72 hours. Thyroid Function Tests: No results for input(s): TSH, T4TOTAL, FREET4, T3FREE, THYROIDAB in the last 72 hours. Anemia Panel: No results for input(s): VITAMINB12, FOLATE, FERRITIN, TIBC, IRON, RETICCTPCT in the last 72 hours. Sepsis Labs: Recent Labs  Lab 05/26/21 1200 05/27/21 0644  PROCALCITON  --  0.46  LATICACIDVEN 1.9  --     Recent Results (from the past 240 hour(s))  Resp Panel by RT-PCR (Flu A&B, Covid) Nasopharyngeal Swab     Status: None   Collection Time: 05/26/21  9:40 AM   Specimen: Nasopharyngeal Swab; Nasopharyngeal(NP) swabs in vial transport medium  Result Value Ref Range Status   SARS Coronavirus 2 by RT PCR NEGATIVE NEGATIVE Final    Comment: (NOTE) SARS-CoV-2 target nucleic acids are NOT DETECTED.  The SARS-CoV-2 RNA is generally detectable in upper respiratory specimens during the acute phase   of infection. The lowest concentration of SARS-CoV-2 viral copies this assay can detect is 138 copies/mL. A negative result does not preclude SARS-Cov-2 infection and should not be used as the sole basis for treatment or other patient management decisions. A negative result may occur with  improper specimen collection/handling, submission of specimen other than nasopharyngeal swab, presence of viral mutation(s) within the areas targeted by this assay, and inadequate number of viral copies(<138 copies/mL). A negative result must be combined with clinical observations, patient history, and epidemiological information. The expected result is Negative.  Fact Sheet for Patients:  EntrepreneurPulse.com.au  Fact Sheet for Healthcare Providers:  IncredibleEmployment.be  This test is no t yet approved or cleared by the [place] FDA and  has been authorized  for detection and/or diagnosis of SARS-CoV-2 by FDA under an Emergency Use Authorization (EUA). This EUA will remain  in effect (meaning this test can be used) for the duration of the COVID-19 declaration under Section 564(b)(1) of the Act, 21 U.S.C.section 360bbb-3(b)(1), unless the authorization is terminated  or revoked sooner.       Influenza A by PCR NEGATIVE NEGATIVE Final   Influenza B by PCR NEGATIVE NEGATIVE Final    Comment: (NOTE) The Xpert Xpress SARS-CoV-2/FLU/RSV plus assay is intended as an aid in the diagnosis of influenza from Nasopharyngeal swab specimens and should not be used as a sole basis for treatment. Nasal washings and aspirates are unacceptable for Xpert Xpress SARS-CoV-2/FLU/RSV testing.  Fact Sheet for Patients: EntrepreneurPulse.com.au  Fact Sheet for Healthcare Providers: IncredibleEmployment.be  This test is not yet approved or cleared by the [place] FDA and has been authorized for detection and/or diagnosis of SARS-CoV-2 by FDA under an Emergency Use Authorization (EUA). This EUA will remain in effect (meaning this test can be used) for the duration of the COVID-19 declaration under Section 564(b)(1) of the Act, 21 U.S.C. section 360bbb-3(b)(1), unless the authorization is terminated or revoked.  Performed at [facility], [address] [place], [place] [address]   Blood culture (routine x 2)     Status: None   Collection Time: 05/26/21 10:08 AM   Specimen: BLOOD  Result Value Ref Range Status   Specimen Description BLOOD BLOOD RIGHT FOREARM  Final   Special Requests   Final    BOTTLES DRAWN AEROBIC AND ANAEROBIC Blood Culture results may not be optimal due to an excessive volume of blood received in culture bottles   Culture   Final    NO GROWTH 5 DAYS Performed at [facility], [address] [place], [place] [address]    Report Status 05/31/2021 FINAL  Final  Blood  culture (routine x 2)     Status: None   Collection Time: 05/26/21 10:08 AM   Specimen: BLOOD  Result Value Ref Range Status   Specimen Description BLOOD BLOOD RIGHT ARM  Final   Special Requests   Final    BOTTLES DRAWN AEROBIC AND ANAEROBIC Blood Culture adequate volume   Culture   Final    NO GROWTH 5 DAYS Performed at [facility], [address], [place], [place] [address]    Report Status 05/31/2021 FINAL  Final  Urine Culture     Status: None   Collection Time: 05/26/21 12:00 PM   Specimen: Urine, Random  Result Value Ref Range Status   Specimen Description   Final    URINE, RANDOM Performed at [facility], [address], [place], [place] [address]    Special  Requests   Final    Normal Performed at [facility], [address] [place], [place] [address]    Culture   Final    NO GROWTH Performed at [facility], [address] [place], [place] [address]    Report Status 05/28/2021 FINAL  Final  Aerobic/Anaerobic Culture (surgical/deep wound)     Status: None   Collection Time: 05/26/21  2:26 PM   Specimen: Abscess  Result Value Ref Range Status   Specimen Description ABSCESS RIGHT PLEURAL  Final   Special Requests SYRINGE  Final   Gram Stain   Final    ABUNDANT WBC PRESENT, PREDOMINANTLY PMN NO ORGANISMS SEEN    Culture   Final    FEW FUSOBACTERIUM NUCLEATUM BETA LACTAMASE NEGATIVE Performed at [facility], [address] [place], [place] [address]    Report Status 05/30/2021 FINAL  Final  Body fluid culture w Gram Stain     Status: None   Collection Time: 05/27/21 11:24 AM   Specimen: Pleura; Body Fluid  Result Value Ref Range Status   Specimen Description   Final    PLEURAL Performed at [facility], [address] [place], [place] [address]    Special Requests   Final    PLEURAL Performed at [facility], [address] [place], [place] [address]    Gram Stain   Final    ABUNDANT  WBC PRESENT,BOTH PMN AND MONONUCLEAR NO ORGANISMS SEEN    Culture   Final    NO GROWTH 3 DAYS Performed at [facility], [address] [place], [place] [address]    Report Status 05/31/2021 FINAL  Final         Radiology Studies: DG Chest Port 1 View  Result Date: 06/01/2021 CLINICAL DATA:  Empyema. EXAM: PORTABLE CHEST 1 VIEW COMPARISON:  05/30/2021 FINDINGS: Normal cardiac silhouette. RIGHT chest tube in place with no pneumothorax. Low lung volumes in the RIGHT hemithorax with small RIGHT effusion. Effusion slightly improved. RIGHT basilar atelectasis. LEFT lung clear. IMPRESSION: 1. No interval change. 2. Chest tube in place on the RIGHT with atelectasis and persistent RIGHT basilar effusion. Effusion slightly improved. Electronically Signed   By: Stewart  Edmunds M.D.   On: 06/01/2021 07:58        Scheduled Meds: . allopurinol  100 mg Oral Daily  . amLODipine  10 mg Oral Daily  . enoxaparin (LOVENOX) injection  40 mg Subcutaneous Q24H  . lidocaine  1 patch Transdermal Q24H  . methylPREDNISolone (SOLU-MEDROL) injection  40 mg Intravenous Daily  . nicotine  14 mg Transdermal Daily  . traZODone  50 mg Oral QHS   Continuous Infusions: . sodium chloride Stopped (06/01/21 0031)  . ampicillin-sulbactam (UNASYN) IV 3 g (06/02/21 1011)     LOS: 7 days    Time spent: 15 minutes    Sudheer B Sreenath, MD [facility] Pager [phone]  If 7PM-7AM, please contact night-coverage 06/02/2021, 10:52 AM  

## 2021-06-02 NOTE — Progress Notes (Signed)
Pulmonary Medicine            HISTORY OF PRESENT ILLNESS  Pleaasant and ready to go home. No acute sxs, no pleurisy, hemoptysis or fever. Discussed discharge planes. COPD stable   PAST MEDICAL HISTORY   Past Medical History:  Diagnosis Date  . Hypertension      SURGICAL HISTORY   History reviewed. No pertinent surgical history.   FAMILY HISTORY   Family History  Problem Relation Age of Onset  . Hypertension Mother   . Prostate cancer Father      SOCIAL HISTORY   Social History   Tobacco Use  . Smoking status: Current Every Day Smoker    Packs/day: 0.50    Types: Cigarettes  . Smokeless tobacco: Never Used     MEDICATIONS    Home Medication:    Current Medication:  Current Facility-Administered Medications:  .  0.9 %  sodium chloride infusion, , Intravenous, PRN, Tresa MooreSreenath, Sudheer B, MD, Stopped at 06/01/21 0031 .  allopurinol (ZYLOPRIM) tablet 100 mg, 100 mg, Oral, Daily, Renae GlossWieting, Richard, MD, 100 mg at 06/02/21 0844 .  amLODipine (NORVASC) tablet 10 mg, 10 mg, Oral, Daily, Sreenath, Sudheer B, MD, 10 mg at 06/02/21 0844 .  Ampicillin-Sulbactam (UNASYN) 3 g in sodium chloride 0.9 % 100 mL IVPB, 3 g, Intravenous, Q6H, Sreenath, Sudheer B, MD, Last Rate: 200 mL/hr at 06/02/21 1011, 3 g at 06/02/21 1011 .  enoxaparin (LOVENOX) injection 40 mg, 40 mg, Subcutaneous, Q24H, Agbata, Tochukwu, MD, 40 mg at 06/01/21 2202 .  lidocaine (LIDODERM) 5 % 1 patch, 1 patch, Transdermal, Q24H, Concha SeFunke, Mary E, MD, 1 patch at 06/02/21 0845 .  methocarbamol (ROBAXIN) tablet 500 mg, 500 mg, Oral, Q6H PRN, Sreenath, Sudheer B, MD .  methylPREDNISolone sodium succinate (SOLU-MEDROL) 40 mg/mL injection 40 mg, 40 mg, Intravenous, Daily, Renae GlossWieting, Richard, MD, 40 mg at 06/02/21 0844 .  nicotine (NICODERM CQ - dosed in mg/24 hours) patch 14 mg, 14 mg, Transdermal, Daily, Agbata, Tochukwu, MD, 14 mg at 06/02/21 0845 .  ondansetron (ZOFRAN) tablet 4 mg, 4 mg, Oral, Q6H PRN  **OR** ondansetron (ZOFRAN) injection 4 mg, 4 mg, Intravenous, Q6H PRN, Agbata, Tochukwu, MD .  oxyCODONE (Oxy IR/ROXICODONE) immediate release tablet 5 mg, 5 mg, Oral, Q4H PRN, Georgeann OppenheimSreenath, Sudheer B, MD, 5 mg at 06/02/21 1448 .  traZODone (DESYREL) tablet 50 mg, 50 mg, Oral, QHS, Wieting, Richard, MD, 50 mg at 06/01/21 2202    ALLERGIES   Patient has no known allergies.     REVIEW OF SYSTEMS    Review of Systems:  Gen:  Denies  fever, sweats, chills weigh loss  HEENT: Denies blurred vision, double vision, ear pain, eye pain, hearing loss, nose bleeds, sore throat Cardiac:  No dizziness, chest pain or heaviness, chest tightness,edema Resp:   Denies cough or sputum porduction, much less shortness of breath,wheezing, hemoptysis,  Gi: no swallowing difficulty, stomach pain, nausea or vomiting, diarrhea, constipation, bowel incontinence Gu:  Denies bladder incontinence, burning urine Ext:   Denies Joint pain, stiffness or swelling Skin: Denies  skin rash, easy bruising or bleeding or hives Endoc:  Denies polyuria, polydipsia , polyphagia or weight change Psych:   Denies depression, insomnia or hallucinations   Other:  All other systems negative   VS: BP 122/82 (BP Location: Right Arm)   Pulse 63   Temp 98.4 F (36.9 C) (Oral)   Resp 18   Ht 5\' 1"  (1.549 m)   Wt 68 kg  SpO2 95%   BMI 28.34 kg/m      PHYSICAL EXAM    GENERAL:NAD, no fevers, chills, no weakness no fatigue HEAD: Normocephalic, atraumatic.  EYES: Pupils equal, round, reactive to light. Extraocular muscles intact. No scleral icterus.  MOUTH: Moist mucosal membrane. Dentition intact. No abscess noted.  EAR, NOSE, THROAT: Clear without exudates. No external lesions.  NECK: Supple. No thyromegaly. No nodules. No JVD.  PULMONARY: moving more air at the right base CARDIOVASCULAR: S1 and S2. Regular rate and rhythm. No murmurs, rubs, or gallops. No edema. Pedal pulses 2+ bilaterally.  GASTROINTESTINAL: Soft,  nontender, nondistended. No masses. Positive bowel sounds. No hepatosplenomegaly.  MUSCULOSKELETAL: No swelling, clubbing, or edema. Range of motion full in all extremities.  NEUROLOGIC: Cranial nerves II through XII are intact. No gross focal neurological deficits. Sensation intact. Reflexes intact.  SKIN: No ulceration, lesions, rashes, or cyanosis. Skin warm and dry. Turgor intact.  PSYCHIATRIC: Mood, affect within normal limits. The patient is awake, alert and oriented x 3. Insight, judgment intact.       IMAGING    DG Chest 2 View  Result Date: 05/26/2021 CLINICAL DATA:  Shortness of breath for 1 week EXAM: CHEST - 2 VIEW COMPARISON:  None. FINDINGS: Sizable right pleural effusion with air-fluid level laterally. The underlying lung is obscured. Clear left lung. Normal heart size. IMPRESSION: Large right pleural effusion with air-fluid level, question symptoms of empyema. Recommend chest CT. Electronically Signed   By: Marnee Spring M.D.   On: 05/26/2021 10:11   CT Angio Chest PE W and/or Wo Contrast  Result Date: 05/26/2021 CLINICAL DATA:  Short of breath. Patient with right flank pain for 1 week. Current chest radiograph demonstrated right mid to lower lung zone opacity with a right mid lung air-fluid level. EXAM: CT ANGIOGRAPHY CHEST WITH CONTRAST TECHNIQUE: Multidetector CT imaging of the chest was performed using the standard protocol during bolus administration of intravenous contrast. Multiplanar CT image reconstructions and MIPs were obtained to evaluate the vascular anatomy. CONTRAST:  69mL OMNIPAQUE IOHEXOL 350 MG/ML SOLN COMPARISON:  Current chest radiograph. FINDINGS: Cardiovascular: Pulmonary arteries are well opacified. There is no evidence of a pulmonary embolism. Heart is normal in size and configuration. Trace amount of pericardial fluid. Great vessels are normal in caliber. No aortic dissection or significant atherosclerosis. Arch branch vessels are widely patent.  Mediastinum/Nodes: 1.3 cm posterior right thyroid nodule. No follow-up recommended. No neck base or axillary masses or enlarged lymph nodes. No mediastinal or left hilar masses or enlarged lymph nodes. Generalized increased soft tissue along the right hilum suggests mild confluent adenopathy. Trachea and esophagus are unremarkable. Lungs/Pleura: Loculated right pleural effusion in the mid to lower right hemithorax. The lateral portion of this contains an air-fluid level corresponding to the air-fluid level noted on the current chest radiograph. The overlying visceral pleura appears mildly thickened and irregular. Most of the right lower lobe is collapsed/atelectatic. There is milder compressive atelectasis in the right middle lobe and a portion of the lateral right upper lobe adjacent to the pleural effusion. Remainder of the right lung is clear. Left lung is clear. No left pleural effusion. No pneumothorax. Bronchi appear patent.  No evidence of a centrally obstructing mass. Upper Abdomen: No acute findings. 1.6 cm left adrenal nodule, nonspecific. Musculoskeletal: No fracture or acute finding.  No bone lesion. Review of the MIP images confirms the above findings. IMPRESSION: 1. No evidence of a pulmonary embolism. 2. Loculated right pleural effusion containing an air-fluid level. Findings  are suspicious for an empyema. There is associated atelectasis predominantly of the right lower lobe, to a lesser degree of the right middle lobe and minimally of the right upper lobe. Electronically Signed   By: Amie Portland M.D.   On: 05/26/2021 10:59   DG Chest Port 1 View  Result Date: 06/01/2021 CLINICAL DATA:  Empyema. EXAM: PORTABLE CHEST 1 VIEW COMPARISON:  05/30/2021 FINDINGS: Normal cardiac silhouette. RIGHT chest tube in place with no pneumothorax. Low lung volumes in the RIGHT hemithorax with small RIGHT effusion. Effusion slightly improved. RIGHT basilar atelectasis. LEFT lung clear. IMPRESSION: 1. No interval  change. 2. Chest tube in place on the RIGHT with atelectasis and persistent RIGHT basilar effusion. Effusion slightly improved. Electronically Signed   By: Genevive Bi M.D.   On: 06/01/2021 07:58   DG Chest Port 1 View  Result Date: 05/30/2021 CLINICAL DATA:  55 year old female with right lung empyema. EXAM: PORTABLE CHEST 1 VIEW COMPARISON:  To below chest 05/28/2021.  CTA chest 05/26/2021. FINDINGS: Portable AP upright view at 0510 hours. Stable right pigtail pleural drain. Moderate residual patchy and veiling right lower lung opacity has only mildly improved since the prior CTA. No pneumothorax identified. Mediastinal contours remain within normal limits. Left lung remains negative. Visualized tracheal air column is within normal limits. No acute osseous abnormality identified. IMPRESSION: 1. Right pigtail pleural drain with mildly improved patchy and confluent right lower lung opacity related to empyema and atelectasis since the CTA on 05/26/2021. 2. No new cardiopulmonary abnormality. Electronically Signed   By: Odessa Samyak Sackmann M.D.   On: 05/30/2021 07:54   DG Chest Port 1 View  Result Date: 05/28/2021 CLINICAL DATA:  Syncope EXAM: PORTABLE CHEST 1 VIEW COMPARISON:  Chest x-ray 05/26/2021, CT chest 05/26/2021 FINDINGS: Interval placement of right lower pigtail drainage catheter. Slight decreased loculated right pleural effusion. Mild cardiomegaly with increased vascular congestion and probable mild edema. Aortic atherosclerosis. Persistent airspace disease at the right middle lobe and right base IMPRESSION: 1. Interval placement of right lower chest drainage catheter with slight decreased loculated right pleural collection 2. Mild cardiomegaly with interim vascular congestion and mild pulmonary edema 3. Consolidation at the right middle lobe and right base Electronically Signed   By: Jasmine Pang M.D.   On: 05/28/2021 00:32   CT IMAGE GUIDED DRAINAGE BY PERCUTANEOUS CATHETER  Result Date:  05/26/2021 INDICATION: 55 year old female with large right pleural effusion, suspicious for empyema. EXAM: CT IMAGE GUIDED DRAINAGE BY PERCUTANEOUS CATHETER COMPARISON:  CT chest from earlier the same day MEDICATIONS: The patient is currently admitted to the hospital and receiving intravenous antibiotics. The antibiotics were administered within an appropriate time frame prior to the initiation of the procedure. ANESTHESIA/SEDATION: Moderate (conscious) sedation was employed during this procedure. A total of Versed 1 mg and Fentanyl 50 mcg was administered intravenously. Moderate Sedation Time: 15 minutes. The patient's level of consciousness and vital signs were monitored continuously by radiology nursing throughout the procedure under my direct supervision. CONTRAST:  None COMPLICATIONS: None immediate. PROCEDURE: Informed written consent was obtained from the patient after a discussion of the risks, benefits and alternatives to treatment. The patient was placed supine, partially left lateral decubitus on the CT gantry and a pre procedural CT was performed re-demonstrating the known fluid collection within the right pleural space. The procedure was planned. A timeout was performed prior to the initiation of the procedure. The right posterolateral and inferior thorax was prepped and draped in the usual sterile fashion. The overlying soft  tissues were anesthetized with 1% lidocaine with epinephrine. Appropriate trajectory was planned with the use of a 22 gauge spinal needle. An 18 gauge trocar needle was advanced into the pleural space and a short Amplatz super stiff wire was coiled within the collection. Appropriate positioning was confirmed with a limited CT scan. The tract was serially dilated allowing placement of a 14 French all-purpose drainage catheter. Appropriate positioning was confirmed with a limited postprocedural CT scan. A total of approximately 60 ml of opaque yellow, purulence fluid was aspirated.  Samples were sent to the lab for fluid analysis. The tube was connected to a pleurovac and sutured in place. A dressing was placed. The patient tolerated the procedure well without immediate post procedural complication. IMPRESSION: Successful CT guided placement of a 70 French all purpose drain catheter into the right empyema with aspiration of approximately 60 mL of purulence fluid. Samples were sent to the laboratory as requested by the ordering clinical team. Marliss Coots, MD [facility] Electronically Signed   By: Marliss Coots MD   On: 05/26/2021 14:44      ASSESSMENT/PLAN  Empyema ( low glucose, super hig ldh, high wbc and pus looking), drain in place. CLINICALLY IMPROVED, HER DRAINAGE IS DOWN TO 50 CC/DAY -d/c chest tube in am - 4- 6 weeks of augmentin  - out patient serial cxr's  - stay of cigarettes  - combivent or the equivalent is resonable - out patient pfts post empyema clearance       Thank you for allowing me to participate in the care of this patient.   Patient/Family are satisfied with care plan and all questions have been answered.  This document was prepared using Dragon voice recognition software and may include unintentional dictation errors.     Ned Clines, M.D.  Division of Pulmonary & Critical Care Medicine  [facility]

## 2021-06-03 ENCOUNTER — Inpatient Hospital Stay: Payer: Self-pay

## 2021-06-03 LAB — CBC WITH DIFFERENTIAL/PLATELET
Abs Immature Granulocytes: 0.21 10*3/uL — ABNORMAL HIGH (ref 0.00–0.07)
Basophils Absolute: 0 10*3/uL (ref 0.0–0.1)
Basophils Relative: 0 %
Eosinophils Absolute: 0 10*3/uL (ref 0.0–0.5)
Eosinophils Relative: 0 %
HCT: 36.5 % (ref 36.0–46.0)
Hemoglobin: 12.5 g/dL (ref 12.0–15.0)
Immature Granulocytes: 2 %
Lymphocytes Relative: 20 %
Lymphs Abs: 2.7 10*3/uL (ref 0.7–4.0)
MCH: 33.8 pg (ref 26.0–34.0)
MCHC: 34.2 g/dL (ref 30.0–36.0)
MCV: 98.6 fL (ref 80.0–100.0)
Monocytes Absolute: 0.9 10*3/uL (ref 0.1–1.0)
Monocytes Relative: 7 %
Neutro Abs: 9.5 10*3/uL — ABNORMAL HIGH (ref 1.7–7.7)
Neutrophils Relative %: 71 %
Platelets: 483 10*3/uL — ABNORMAL HIGH (ref 150–400)
RBC: 3.7 MIL/uL — ABNORMAL LOW (ref 3.87–5.11)
RDW: 12.3 % (ref 11.5–15.5)
WBC: 13.4 10*3/uL — ABNORMAL HIGH (ref 4.0–10.5)
nRBC: 0 % (ref 0.0–0.2)

## 2021-06-03 MED ORDER — AMOXICILLIN-POT CLAVULANATE 875-125 MG PO TABS: 1.0000 | ORAL_TABLET | Freq: Two times a day (BID) | ORAL | 0 refills | Status: AC

## 2021-06-03 MED ORDER — NICOTINE 14 MG/24HR TD PT24: 14.0000 mg | MEDICATED_PATCH | Freq: Every day | TRANSDERMAL | 0 refills | Status: AC

## 2021-06-03 MED ORDER — METHOCARBAMOL 500 MG PO TABS: 500.0000 mg | ORAL_TABLET | Freq: Four times a day (QID) | ORAL | 0 refills | Status: AC | PRN

## 2021-06-03 NOTE — Progress Notes (Signed)
Pulmonary Medicine            HISTORY OF PRESENT ILLNESS  Chest tube removed, small ex-vacuo pneumo, no distress.  Chest tube removal.   PAST MEDICAL HISTORY   Past Medical History:  Diagnosis Date  . Hypertension      SURGICAL HISTORY   History reviewed. No pertinent surgical history.   FAMILY HISTORY   Family History  Problem Relation Age of Onset  . Hypertension Mother   . Prostate cancer Father      SOCIAL HISTORY   Social History   Tobacco Use  . Smoking status: Current Every Day Smoker    Packs/day: 0.50    Types: Cigarettes  . Smokeless tobacco: Never Used     MEDICATIONS    Home Medication:  Current Outpatient Rx  . Order #: [id number] Class: Normal  . Order #: [id number] Class: Normal  . [START ON 06/04/2021] Order #: [id number] Class: Normal    Current Medication:  Current Facility-Administered Medications:  .  0.9 %  sodium chloride infusion, , Intravenous, PRN, Tresa Moore, MD, Stopped at 05/31/21 2317 .  allopurinol (ZYLOPRIM) tablet 100 mg, 100 mg, Oral, Daily, Renae Gloss, Richard, MD, 100 mg at 06/03/21 0915 .  amLODipine (NORVASC) tablet 10 mg, 10 mg, Oral, Daily, Sreenath, Sudheer B, MD, 10 mg at 06/03/21 0915 .  Ampicillin-Sulbactam (UNASYN) 3 g in sodium chloride 0.9 % 100 mL IVPB, 3 g, Intravenous, Q6H, Sreenath, Sudheer B, MD, Last Rate: 200 mL/hr at 06/03/21 0916, 3 g at 06/03/21 0916 .  enoxaparin (LOVENOX) injection 40 mg, 40 mg, Subcutaneous, Q24H, Agbata, Tochukwu, MD, 40 mg at 06/02/21 2152 .  lidocaine (LIDODERM) 5 % 1 patch, 1 patch, Transdermal, Q24H, Concha Se, MD, 1 patch at 06/03/21 0915 .  methocarbamol (ROBAXIN) tablet 500 mg, 500 mg, Oral, Q6H PRN, Sreenath, Sudheer B, MD .  methylPREDNISolone sodium succinate (SOLU-MEDROL) 40 mg/mL injection 40 mg, 40 mg, Intravenous, Daily, Wieting, Richard, MD, 40 mg at 06/03/21 0915 .  nicotine (NICODERM CQ - dosed in mg/24 hours) patch 14 mg, 14 mg, Transdermal,  Daily, Agbata, Tochukwu, MD, 14 mg at 06/03/21 0914 .  ondansetron (ZOFRAN) tablet 4 mg, 4 mg, Oral, Q6H PRN **OR** ondansetron (ZOFRAN) injection 4 mg, 4 mg, Intravenous, Q6H PRN, Agbata, Tochukwu, MD .  oxyCODONE (Oxy IR/ROXICODONE) immediate release tablet 5 mg, 5 mg, Oral, Q4H PRN, Georgeann Oppenheim, Sudheer B, MD, 5 mg at 06/03/21 0702 .  traZODone (DESYREL) tablet 50 mg, 50 mg, Oral, QHS, Wieting, Richard, MD, 50 mg at 06/02/21 2307    ALLERGIES   Patient has no known allergies.     REVIEW OF SYSTEMS    Review of Systems:  Gen:  Denies  fever, sweats, chills weigh loss  HEENT: Denies blurred vision, double vision, ear pain, eye pain, hearing loss, nose bleeds, sore throat Cardiac:  No dizziness, chest pain or heaviness, chest tightness,edema Resp:   Denies cough or sputum porduction, shortness of breath,wheezing, hemoptysis,  Gi: Denies swallowing difficulty, stomach pain, nausea or vomiting, diarrhea, constipation, bowel incontinence Gu:  Denies bladder incontinence, burning urine Ext:   Denies Joint pain, stiffness or swelling Skin: Denies  skin rash, easy bruising or bleeding or hives Endoc:  Denies polyuria, polydipsia , polyphagia or weight change Psych:   Denies depression, insomnia or hallucinations   Other:  All other systems negative   VS: BP 126/74   Pulse (!) 57   Temp 98.2 F (36.8 C) (Oral)   Resp 20  Ht 5\' 1"  (1.549 m)   Wt 68 kg   SpO2 96%   BMI 28.34 kg/m      PHYSICAL EXAM    GENERAL:NAD, no fevers, chills, no weakness no fatigue HEAD: Normocephalic, atraumatic.  EYES: Pupils equal, round, reactive to light. Extraocular muscles intact. No scleral icterus.  MOUTH: Moist mucosal membrane. Dentition intact. No abscess noted.  EAR, NOSE, THROAT: Clear without exudates. No external lesions.  NECK: Supple. No thyromegaly. No nodules. No JVD.  PULMONARY: chest tube out CARDIOVASCULAR: S1 and S2. Regular rate and rhythm. No murmurs, rubs, or gallops. No  edema. Pedal pulses 2+ bilaterally.  GASTROINTESTINAL: Soft, nontender, nondistended. No masses. Positive bowel sounds. No hepatosplenomegaly.  MUSCULOSKELETAL: No swelling, clubbing, or edema. Range of motion full in all extremities.  NEUROLOGIC: Cranial nerves II through XII are intact. No gross focal neurological deficits. Sensation intact. Reflexes intact.  SKIN: No ulceration, lesions, rashes, or cyanosis. Skin warm and dry. Turgor intact.  PSYCHIATRIC: Mood, affect within normal limits. The patient is awake, alert and oriented x 3. Insight, judgment intact.       IMAGING    DG Chest 2 View  Result Date: 05/26/2021 CLINICAL DATA:  Shortness of breath for 1 week EXAM: CHEST - 2 VIEW COMPARISON:  None. FINDINGS: Sizable right pleural effusion with air-fluid level laterally. The underlying lung is obscured. Clear left lung. Normal heart size. IMPRESSION: Large right pleural effusion with air-fluid level, question symptoms of empyema. Recommend chest CT. Electronically Signed   By: Marnee SpringJonathon  Watts M.D.   On: 05/26/2021 10:11   CT Angio Chest PE W and/or Wo Contrast  Result Date: 05/26/2021 CLINICAL DATA:  Short of breath. Patient with right flank pain for 1 week. Current chest radiograph demonstrated right mid to lower lung zone opacity with a right mid lung air-fluid level. EXAM: CT ANGIOGRAPHY CHEST WITH CONTRAST TECHNIQUE: Multidetector CT imaging of the chest was performed using the standard protocol during bolus administration of intravenous contrast. Multiplanar CT image reconstructions and MIPs were obtained to evaluate the vascular anatomy. CONTRAST:  75mL OMNIPAQUE IOHEXOL 350 MG/ML SOLN COMPARISON:  Current chest radiograph. FINDINGS: Cardiovascular: Pulmonary arteries are well opacified. There is no evidence of a pulmonary embolism. Heart is normal in size and configuration. Trace amount of pericardial fluid. Great vessels are normal in caliber. No aortic dissection or significant  atherosclerosis. Arch branch vessels are widely patent. Mediastinum/Nodes: 1.3 cm posterior right thyroid nodule. No follow-up recommended. No neck base or axillary masses or enlarged lymph nodes. No mediastinal or left hilar masses or enlarged lymph nodes. Generalized increased soft tissue along the right hilum suggests mild confluent adenopathy. Trachea and esophagus are unremarkable. Lungs/Pleura: Loculated right pleural effusion in the mid to lower right hemithorax. The lateral portion of this contains an air-fluid level corresponding to the air-fluid level noted on the current chest radiograph. The overlying visceral pleura appears mildly thickened and irregular. Most of the right lower lobe is collapsed/atelectatic. There is milder compressive atelectasis in the right middle lobe and a portion of the lateral right upper lobe adjacent to the pleural effusion. Remainder of the right lung is clear. Left lung is clear. No left pleural effusion. No pneumothorax. Bronchi appear patent.  No evidence of a centrally obstructing mass. Upper Abdomen: No acute findings. 1.6 cm left adrenal nodule, nonspecific. Musculoskeletal: No fracture or acute finding.  No bone lesion. Review of the MIP images confirms the above findings. IMPRESSION: 1. No evidence of a pulmonary embolism. 2. Loculated  right pleural effusion containing an air-fluid level. Findings are suspicious for an empyema. There is associated atelectasis predominantly of the right lower lobe, to a lesser degree of the right middle lobe and minimally of the right upper lobe. Electronically Signed   By: Amie Portland M.D.   On: 05/26/2021 10:59   DG Chest Port 1 View  Result Date: 06/03/2021 CLINICAL DATA:  Post right-sided chest tube removal. EXAM: PORTABLE CHEST 1 VIEW COMPARISON:  Earlier same day; 05/30/2020; 05/28/2021; 05/26/2021 FINDINGS: Grossly unchanged enlarged cardiac silhouette and mediastinal contours. Interval development of a trace presumed ex  vacuo pneumothorax following right-sided chest tube removal. No significant apical component. Small right-sided effusion with associated right basilar heterogeneous/consolidative opacities. Left hemithorax remains well aerated. No new focal airspace opacities. Pulmonary venous congestion without frank evidence of edema. No acute osseous abnormalities. IMPRESSION: Trace presumed ex vacuo pneumothorax following right-sided chest tube removal with otherwise unchanged small residual right-sided effusion with associated right basilar consolidative opacities. Electronically Signed   By: Simonne Come M.D.   On: 06/03/2021 13:17   DG Chest Port 1 View  Result Date: 06/03/2021 CLINICAL DATA:  Empyema. EXAM: PORTABLE CHEST 1 VIEW COMPARISON:  06/01/2021. FINDINGS: Right chest tube in stable position. Persistent right base atelectasis/infiltrate. Stable mild right lung base pleural effusion/pleural thickening. No interim change. No pneumothorax. IMPRESSION: Right chest tube in stable position. Persistent right base atelectasis/infiltrate. Stable mild right base pleural effusion/pleural thickening. No interim change. Electronically Signed   By: Maisie Fus  Register   On: 06/03/2021 05:50   DG Chest Port 1 View  Result Date: 06/01/2021 CLINICAL DATA:  Empyema. EXAM: PORTABLE CHEST 1 VIEW COMPARISON:  05/30/2021 FINDINGS: Normal cardiac silhouette. RIGHT chest tube in place with no pneumothorax. Low lung volumes in the RIGHT hemithorax with small RIGHT effusion. Effusion slightly improved. RIGHT basilar atelectasis. LEFT lung clear. IMPRESSION: 1. No interval change. 2. Chest tube in place on the RIGHT with atelectasis and persistent RIGHT basilar effusion. Effusion slightly improved. Electronically Signed   By: Genevive Bi M.D.   On: 06/01/2021 07:58   DG Chest Port 1 View  Result Date: 05/30/2021 CLINICAL DATA:  55 year old female with right lung empyema. EXAM: PORTABLE CHEST 1 VIEW COMPARISON:  To below chest  05/28/2021.  CTA chest 05/26/2021. FINDINGS: Portable AP upright view at 0510 hours. Stable right pigtail pleural drain. Moderate residual patchy and veiling right lower lung opacity has only mildly improved since the prior CTA. No pneumothorax identified. Mediastinal contours remain within normal limits. Left lung remains negative. Visualized tracheal air column is within normal limits. No acute osseous abnormality identified. IMPRESSION: 1. Right pigtail pleural drain with mildly improved patchy and confluent right lower lung opacity related to empyema and atelectasis since the CTA on 05/26/2021. 2. No new cardiopulmonary abnormality. Electronically Signed   By: [place] Jaeshaun Riva M.D.   On: 05/30/2021 07:54   DG Chest Port 1 View  Result Date: 05/28/2021 CLINICAL DATA:  Syncope EXAM: PORTABLE CHEST 1 VIEW COMPARISON:  Chest x-ray 05/26/2021, CT chest 05/26/2021 FINDINGS: Interval placement of right lower pigtail drainage catheter. Slight decreased loculated right pleural effusion. Mild cardiomegaly with increased vascular congestion and probable mild edema. Aortic atherosclerosis. Persistent airspace disease at the right middle lobe and right base IMPRESSION: 1. Interval placement of right lower chest drainage catheter with slight decreased loculated right pleural collection 2. Mild cardiomegaly with interim vascular congestion and mild pulmonary edema 3. Consolidation at the right middle lobe and right base Electronically Signed  By: Jasmine Pang M.D.   On: 05/28/2021 00:32   CT IMAGE GUIDED DRAINAGE BY PERCUTANEOUS CATHETER  Result Date: 05/26/2021 INDICATION: 55 year old female with large right pleural effusion, suspicious for empyema. EXAM: CT IMAGE GUIDED DRAINAGE BY PERCUTANEOUS CATHETER COMPARISON:  CT chest from earlier the same day MEDICATIONS: The patient is currently admitted to the hospital and receiving intravenous antibiotics. The antibiotics were administered within an appropriate time frame prior to  the initiation of the procedure. ANESTHESIA/SEDATION: Moderate (conscious) sedation was employed during this procedure. A total of Versed 1 mg and Fentanyl 50 mcg was administered intravenously. Moderate Sedation Time: 15 minutes. The patient's level of consciousness and vital signs were monitored continuously by radiology nursing throughout the procedure under my direct supervision. CONTRAST:  None COMPLICATIONS: None immediate. PROCEDURE: Informed written consent was obtained from the patient after a discussion of the risks, benefits and alternatives to treatment. The patient was placed supine, partially left lateral decubitus on the CT gantry and a pre procedural CT was performed re-demonstrating the known fluid collection within the right pleural space. The procedure was planned. A timeout was performed prior to the initiation of the procedure. The right posterolateral and inferior thorax was prepped and draped in the usual sterile fashion. The overlying soft tissues were anesthetized with 1% lidocaine with epinephrine. Appropriate trajectory was planned with the use of a 22 gauge spinal needle. An 18 gauge trocar needle was advanced into the pleural space and a short Amplatz super stiff wire was coiled within the collection. Appropriate positioning was confirmed with a limited CT scan. The tract was serially dilated allowing placement of a 14 French all-purpose drainage catheter. Appropriate positioning was confirmed with a limited postprocedural CT scan. A total of approximately 60 ml of opaque yellow, purulence fluid was aspirated. Samples were sent to the lab for fluid analysis. The tube was connected to a pleurovac and sutured in place. A dressing was placed. The patient tolerated the procedure well without immediate post procedural complication. IMPRESSION: Successful CT guided placement of a 61 French all purpose drain catheter into the right empyema with aspiration of approximately 60 mL of purulence  fluid. Samples were sent to the laboratory as requested by the ordering clinical team. Marliss Coots, MD [facility] Electronically Signed   By: Marliss Coots MD   On: 05/26/2021 14:44      ASSESSMENT/PLAN   Empyema ( low glucose, super hig ldh, high wbc and pus looking), drain in place. Clinically improved,, tube out. Post ex vacua pneumo. Should resolve - repeat cxr in 2 hours - 4 weeks of augmentin  - out patient serial cxr's  - stay of cigarettes  - combivent or the equivalent is resonable - out patient pfts post empyema clearance     Thank you for allowing me to participate in the care of this patient.   Patient/Family are satisfied with care plan and all questions have been answered.  This document was prepared using Dragon voice recognition software and may include unintentional dictation errors.     Ned Clines, M.D.  Division of Pulmonary & Critical Care Medicine  [facility]

## 2021-06-03 NOTE — Progress Notes (Signed)
Patient brought to specials for CT discontinue per DR Grace Isaac, tolerated well. Vitals stable pre and post procedure. No shortness of breath pre/post Ct Dc, bp/hr stable pre; and post procedure, small visualized pneumo post d/c per Dr Grace Isaac, spoke to Rn on 2C, will send back to room, on continual pulse oximetry, NPO until repeat CXR @ 1430,with patient aware. no visual signs of distress at this time.

## 2021-06-03 NOTE — Discharge Summary (Signed)
Physician Discharge Summary  Tonya Johnson KGU:[id number] DOB: [birth year] DOA: 05/26/2021  PCP: Lynnell Jude, MD  Admit date: 05/26/2021 Discharge date: 06/03/2021  Admitted From: Home Disposition: Home  Recommendations for Outpatient Follow-up:  1. Follow up with PCP in 1-2 weeks 2. Follow-up with pulmonology 1 week  Home Health: No Equipment/Devices: None  Discharge Condition: Stable CODE STATUS: Full Diet recommendation: Heart healthy Brief/Interim Summary: 55 year old female with history of hypertension and gout presents with a weeks worth of pain on her right side. Presented to the hospital 5/30 and found to have an empyema. Had chest tube placed by interventional radiology.  Pulmonology consulted and following for chest tube management. 6/1: Patient remains hemodynamically stable.  Small amount of purulent fluid, 60 cc liberated upon placement of tube.  Follow-up chest x-ray shows slightly better size of effusion. 6/3: Follow-up chest x-ray demonstrates slight increase in size of right-sided pleural effusion.  Chest tube remains in place.  Patient feels well overall 6/4: WBC decreasing.  Patient feels well overall 6/5: WBC continues to decrease.  Follow-up chest x-ray this morning shows interval improvement in size of effusion 6/7: White count continues to downtrend.  Interval CXR demonstrates mild improvement in right-sided effusion.  Minimal output from chest tube.  Interventional radiology contacted.  Chest tube removed successfully.  Patient discharged home in stable condition.  We will follow-up with pulmonology postdischarge.  4 weeks of Augmentin prescribed.   Discharge Diagnoses:  Principal Problem:   Empyema, right ([place]) Active Problems:   Acute lower UTI   Essential hypertension   Gout   Nicotine dependence   Hypokalemia   Sepsis ([place])   Pleuritic chest pain   Acute cystitis with hematuria   Diarrhea   Insomnia  Right side empyema Sepsis secondary to  above Acute hypoxic respiratory failure secondary to above Sepsis diagnoses met with tachycardia, tachypnea, leukocytosis Chest tube to remain in place 6/3: Patient feels well.  Follow-up chest x-ray slight interval decrease in size of right pleural effusion Fluid culture initially with Fusobacterium, now showing no growth Suspect anaerobes 6/5: Interval improvement in the right pleural effusion per portable chest x-ray with 6/6: 24-hour chest tube output 50 cc 6/7: Chest tube output 10 cc.  Chest tube removed Discharged home on 4 weeks of Augmentin Follow-up outpatient pulmonology Advised tobacco cessation, nicotine patches prescribed Outpatient chest x-ray  Pleuritic type chest pain Solu-Medrol in house.  Discontinue at time of discharge  Acute cystitis associated with hematuria On antibiotics for empyema Follow urine culture, no growth  Acute diarrhea Resolved after discontinuation of senna  Insomnia Nightly trazodone  History of gout Continue allopurinol  History of hypertension Improved control over interval Home Norvasc 10 mg a day ordered  Discharge Instructions   Allergies as of 06/03/2021   No Known Allergies     Medication List    TAKE these medications   allopurinol 100 MG tablet Commonly known as: ZYLOPRIM Take 100 mg by mouth daily.   amLODipine 10 MG tablet Commonly known as: NORVASC Take 10 mg by mouth daily.   amoxicillin-clavulanate 875-125 MG tablet Commonly known as: Augmentin Take 1 tablet by mouth 2 (two) times daily.   colchicine 0.6 MG tablet Take 0.6 mg by mouth as directed.   methocarbamol 500 MG tablet Commonly known as: ROBAXIN Take 1 tablet (500 mg total) by mouth every 6 (six) hours as needed for muscle spasms.   nicotine 14 mg/24hr patch Commonly known as: NICODERM CQ - dosed in mg/24 hours Place  1 patch (14 mg total) onto the skin daily. Start taking on: June 04, 2021   oxyCODONE-acetaminophen 10-325 MG  tablet Commonly known as: PERCOCET Take 1 tablet by mouth every 8 (eight) hours as needed for pain.   potassium chloride 10 MEQ tablet Commonly known as: KLOR-CON Take 10 mEq by mouth 2 (two) times daily.   tamsulosin 0.4 MG Caps capsule Commonly known as: FLOMAX Take 0.4 mg by mouth.       Follow-up Information    Lynnell Jude, MD. Schedule an appointment as soon as possible for a visit in 1 week(s).   Specialty: Family Medicine Contact information: [place] [address] [phone]        Erby Pian, MD. Schedule an appointment as soon as possible for a visit in 1 week(s).   Specialty: Specialist Contact information: [place] [address] [phone]              No Known Allergies  Consultations:  Pulmonology   Procedures/Studies: DG Chest 2 View  Result Date: 05/26/2021 CLINICAL DATA:  Shortness of breath for 1 week EXAM: CHEST - 2 VIEW COMPARISON:  None. FINDINGS: Sizable right pleural effusion with air-fluid level laterally. The underlying lung is obscured. Clear left lung. Normal heart size. IMPRESSION: Large right pleural effusion with air-fluid level, question symptoms of empyema. Recommend chest CT. Electronically Signed   By: Monte Fantasia M.D.   On: 05/26/2021 10:11   CT Angio Chest PE W and/or Wo Contrast  Result Date: 05/26/2021 CLINICAL DATA:  Short of breath. Patient with right flank pain for 1 week. Current chest radiograph demonstrated right mid to lower lung zone opacity with a right mid lung air-fluid level. EXAM: CT ANGIOGRAPHY CHEST WITH CONTRAST TECHNIQUE: Multidetector CT imaging of the chest was performed using the standard protocol during bolus administration of intravenous contrast. Multiplanar CT image reconstructions and MIPs were obtained to evaluate the vascular anatomy. CONTRAST:  37m OMNIPAQUE IOHEXOL 350 MG/ML SOLN COMPARISON:  Current chest radiograph. FINDINGS: Cardiovascular:  Pulmonary arteries are well opacified. There is no evidence of a pulmonary embolism. Heart is normal in size and configuration. Trace amount of pericardial fluid. Great vessels are normal in caliber. No aortic dissection or significant atherosclerosis. Arch branch vessels are widely patent. Mediastinum/Nodes: 1.3 cm posterior right thyroid nodule. No follow-up recommended. No neck base or axillary masses or enlarged lymph nodes. No mediastinal or left hilar masses or enlarged lymph nodes. Generalized increased soft tissue along the right hilum suggests mild confluent adenopathy. Trachea and esophagus are unremarkable. Lungs/Pleura: Loculated right pleural effusion in the mid to lower right hemithorax. The lateral portion of this contains an air-fluid level corresponding to the air-fluid level noted on the current chest radiograph. The overlying visceral pleura appears mildly thickened and irregular. Most of the right lower lobe is collapsed/atelectatic. There is milder compressive atelectasis in the right middle lobe and a portion of the lateral right upper lobe adjacent to the pleural effusion. Remainder of the right lung is clear. Left lung is clear. No left pleural effusion. No pneumothorax. Bronchi appear patent.  No evidence of a centrally obstructing mass. Upper Abdomen: No acute findings. 1.6 cm left adrenal nodule, nonspecific. Musculoskeletal: No fracture or acute finding.  No bone lesion. Review of the MIP images confirms the above findings. IMPRESSION: 1. No evidence of a pulmonary embolism. 2. Loculated right pleural effusion containing an air-fluid level. Findings are suspicious for an empyema. There is associated atelectasis  predominantly of the right lower lobe, to a lesser degree of the right middle lobe and minimally of the right upper lobe. Electronically Signed   By: Lajean Manes M.D.   On: 05/26/2021 10:59   DG Chest Port 1 View  Result Date: 06/03/2021 CLINICAL DATA:  Empyema. EXAM: PORTABLE  CHEST 1 VIEW COMPARISON:  06/01/2021. FINDINGS: Right chest tube in stable position. Persistent right base atelectasis/infiltrate. Stable mild right lung base pleural effusion/pleural thickening. No interim change. No pneumothorax. IMPRESSION: Right chest tube in stable position. Persistent right base atelectasis/infiltrate. Stable mild right base pleural effusion/pleural thickening. No interim change. Electronically Signed   By: Marcello Moores  Register   On: 06/03/2021 05:50   DG Chest Port 1 View  Result Date: 06/01/2021 CLINICAL DATA:  Empyema. EXAM: PORTABLE CHEST 1 VIEW COMPARISON:  05/30/2021 FINDINGS: Normal cardiac silhouette. RIGHT chest tube in place with no pneumothorax. Low lung volumes in the RIGHT hemithorax with small RIGHT effusion. Effusion slightly improved. RIGHT basilar atelectasis. LEFT lung clear. IMPRESSION: 1. No interval change. 2. Chest tube in place on the RIGHT with atelectasis and persistent RIGHT basilar effusion. Effusion slightly improved. Electronically Signed   By: Suzy Bouchard M.D.   On: 06/01/2021 07:58   DG Chest Port 1 View  Result Date: 05/30/2021 CLINICAL DATA:  55 year old female with right lung empyema. EXAM: PORTABLE CHEST 1 VIEW COMPARISON:  To below chest 05/28/2021.  CTA chest 05/26/2021. FINDINGS: Portable AP upright view at 0510 hours. Stable right pigtail pleural drain. Moderate residual patchy and veiling right lower lung opacity has only mildly improved since the prior CTA. No pneumothorax identified. Mediastinal contours remain within normal limits. Left lung remains negative. Visualized tracheal air column is within normal limits. No acute osseous abnormality identified. IMPRESSION: 1. Right pigtail pleural drain with mildly improved patchy and confluent right lower lung opacity related to empyema and atelectasis since the CTA on 05/26/2021. 2. No new cardiopulmonary abnormality. Electronically Signed   By: Genevie Ann M.D.   On: 05/30/2021 07:54   DG Chest Port  1 View  Result Date: 05/28/2021 CLINICAL DATA:  Syncope EXAM: PORTABLE CHEST 1 VIEW COMPARISON:  Chest x-ray 05/26/2021, CT chest 05/26/2021 FINDINGS: Interval placement of right lower pigtail drainage catheter. Slight decreased loculated right pleural effusion. Mild cardiomegaly with increased vascular congestion and probable mild edema. Aortic atherosclerosis. Persistent airspace disease at the right middle lobe and right base IMPRESSION: 1. Interval placement of right lower chest drainage catheter with slight decreased loculated right pleural collection 2. Mild cardiomegaly with interim vascular congestion and mild pulmonary edema 3. Consolidation at the right middle lobe and right base Electronically Signed   By: Donavan Foil M.D.   On: 05/28/2021 00:32   CT IMAGE GUIDED DRAINAGE BY PERCUTANEOUS CATHETER  Result Date: 05/26/2021 INDICATION: 55 year old female with large right pleural effusion, suspicious for empyema. EXAM: CT IMAGE GUIDED DRAINAGE BY PERCUTANEOUS CATHETER COMPARISON:  CT chest from earlier the same day MEDICATIONS: The patient is currently admitted to the hospital and receiving intravenous antibiotics. The antibiotics were administered within an appropriate time frame prior to the initiation of the procedure. ANESTHESIA/SEDATION: Moderate (conscious) sedation was employed during this procedure. A total of Versed 1 mg and Fentanyl 50 mcg was administered intravenously. Moderate Sedation Time: 15 minutes. The patient's level of consciousness and vital signs were monitored continuously by radiology nursing throughout the procedure under my direct supervision. CONTRAST:  None COMPLICATIONS: None immediate. PROCEDURE: Informed written consent was obtained from the patient after a  discussion of the risks, benefits and alternatives to treatment. The patient was placed supine, partially left lateral decubitus on the CT gantry and a pre procedural CT was performed re-demonstrating the known fluid  collection within the right pleural space. The procedure was planned. A timeout was performed prior to the initiation of the procedure. The right posterolateral and inferior thorax was prepped and draped in the usual sterile fashion. The overlying soft tissues were anesthetized with 1% lidocaine with epinephrine. Appropriate trajectory was planned with the use of a 22 gauge spinal needle. An 18 gauge trocar needle was advanced into the pleural space and a short Amplatz super stiff wire was coiled within the collection. Appropriate positioning was confirmed with a limited CT scan. The tract was serially dilated allowing placement of a 14 French all-purpose drainage catheter. Appropriate positioning was confirmed with a limited postprocedural CT scan. A total of approximately 60 ml of opaque yellow, purulence fluid was aspirated. Samples were sent to the lab for fluid analysis. The tube was connected to a pleurovac and sutured in place. A dressing was placed. The patient tolerated the procedure well without immediate post procedural complication. IMPRESSION: Successful CT guided placement of a 86 French all purpose drain catheter into the right empyema with aspiration of approximately 60 mL of purulence fluid. Samples were sent to the laboratory as requested by the ordering clinical team. Ruthann Cancer, MD [facility] Electronically Signed   By: Ruthann Cancer MD   On: 05/26/2021 14:44    (Echo, Carotid, EGD, Colonoscopy, ERCP)    Subjective: Seen and examined on the day of discharge.  Stable, no distress.  Feels well.  Stable for discharge home  Discharge Exam: Vitals:   06/03/21 0913 06/03/21 1118  BP: 134/72 135/80  Pulse: 67 60  Resp: 18   Temp: 98.1 F (36.7 C) 98.2 F (36.8 C)  SpO2: 96% 96%   Vitals:   06/02/21 2312 06/03/21 0447 06/03/21 0913 06/03/21 1118  BP: (!) 139/98 (!) 141/80 134/72 135/80  Pulse: (!) 57 (!) 53 67 60  Resp:  _0 Temp: 98.5 F (36.9 C) 98.2 F (36.8 C) 98.1 F (36.7 C) 98.2 F (36.8 C)  TempSrc: Oral Oral Oral Oral  SpO2: 95% 97% 96% 96%  Weight:      Height:        General: Pt is alert, awake, not in acute distress Cardiovascular: RRR, S1/S2 +, no rubs, no gallops Respiratory: CTA bilaterally, no wheezing, no rhonchi Abdominal: Soft, NT, ND, bowel sounds + Extremities: no edema, no cyanosis    The results of significant diagnostics from this hospitalization (including imaging, microbiology, ancillary and laboratory) are listed below for reference.     Microbiology: Recent Results (from the past 240 hour(s))  Resp Panel by RT-PCR (Flu A&B, Covid) Nasopharyngeal Swab     Status: None   Collection Time: 05/26/21  9:40 AM   Specimen: Nasopharyngeal Swab; Nasopharyngeal(NP) swabs in vial transport medium  Result Value Ref Range Status   SARS Coronavirus 2 by RT PCR NEGATIVE NEGATIVE Final    Comment: (NOTE) SARS-CoV-2 target nucleic acids are NOT DETECTED.  The SARS-CoV-2 RNA is generally detectable in upper respiratory specimens during the acute phase of infection. The lowest concentration of SARS-CoV-2 viral copies this assay can detect is 138 copies/mL. A negative result does not preclude SARS-Cov-2 infection and should not be used as the sole basis for treatment or other patient management decisions. A  negative result may occur with  improper specimen collection/handling, submission of specimen other than nasopharyngeal swab, presence of viral mutation(s) within the areas targeted by this assay, and inadequate number of viral copies(<138 copies/mL). A negative result must be combined with clinical observations, patient history, and epidemiological information. The expected result is Negative.  Fact Sheet for Patients:  EntrepreneurPulse.com.au  Fact Sheet for Healthcare Providers:  IncredibleEmployment.be  This test is no t yet  approved or cleared by the [place] FDA and  has been authorized for detection and/or diagnosis of SARS-CoV-2 by FDA under an Emergency Use Authorization (EUA). This EUA will remain  in effect (meaning this test can be used) for the duration of the COVID-19 declaration under Section 564(b)(1) of the Act, 21 U.S.C.section 360bbb-3(b)(1), unless the authorization is terminated  or revoked sooner.       Influenza A by PCR NEGATIVE NEGATIVE Final   Influenza B by PCR NEGATIVE NEGATIVE Final    Comment: (NOTE) The Xpert Xpress SARS-CoV-2/FLU/RSV plus assay is intended as an aid in the diagnosis of influenza from Nasopharyngeal swab specimens and should not be used as a sole basis for treatment. Nasal washings and aspirates are unacceptable for Xpert Xpress SARS-CoV-2/FLU/RSV testing.  Fact Sheet for Patients: EntrepreneurPulse.com.au  Fact Sheet for Healthcare Providers: IncredibleEmployment.be  This test is not yet approved or cleared by the [place] FDA and has been authorized for detection and/or diagnosis of SARS-CoV-2 by FDA under an Emergency Use Authorization (EUA). This EUA will remain in effect (meaning this test can be used) for the duration of the COVID-19 declaration under Section 564(b)(1) of the Act, 21 U.S.C. section 360bbb-3(b)(1), unless the authorization is terminated or revoked.  Performed at [facility], [address] [place], [place] [address]   Blood culture (routine x 2)     Status: None   Collection Time: 05/26/21 10:08 AM   Specimen: BLOOD  Result Value Ref Range Status   Specimen Description BLOOD BLOOD RIGHT FOREARM  Final   Special Requests   Final    BOTTLES DRAWN AEROBIC AND ANAEROBIC Blood Culture results may not be optimal due to an excessive volume of blood received in culture bottles   Culture   Final    NO GROWTH 5 DAYS Performed at [facility], [address],  [place], [place] [address]    Report Status 05/31/2021 FINAL  Final  Blood culture (routine x 2)     Status: None   Collection Time: 05/26/21 10:08 AM   Specimen: BLOOD  Result Value Ref Range Status   Specimen Description BLOOD BLOOD RIGHT ARM  Final   Special Requests   Final    BOTTLES DRAWN AEROBIC AND ANAEROBIC Blood Culture adequate volume   Culture   Final    NO GROWTH 5 DAYS Performed at [facility], [address], [place], [place] [address]    Report Status 05/31/2021 FINAL  Final  Urine Culture     Status: None   Collection Time: 05/26/21 12:00 PM   Specimen: Urine, Random  Result Value Ref Range Status   Specimen Description   Final    URINE, RANDOM Performed at [facility], [address], [place],  [address]    Special Requests   Final    Normal Performed at [facility], [address] [place],  [address]    Culture   Final    NO GROWTH Performed at [facility], [place]  [address] [place], [place] [address]    Report Status 05/28/2021 FINAL  Final  Aerobic/Anaerobic Culture (surgical/deep wound)     Status: None   Collection Time: 05/26/21  2:26 PM   Specimen: Abscess  Result Value Ref Range Status   Specimen Description ABSCESS RIGHT PLEURAL  Final   Special Requests SYRINGE  Final   Gram Stain   Final    ABUNDANT WBC PRESENT, PREDOMINANTLY PMN NO ORGANISMS SEEN    Culture   Final    FEW FUSOBACTERIUM NUCLEATUM BETA LACTAMASE NEGATIVE Performed at [facility], [place] [address] [place], [place] [address]    Report Status 05/30/2021 FINAL  Final  Body fluid culture w Gram Stain     Status: None   Collection Time: 05/27/21 11:24 AM   Specimen: Pleura; Body Fluid  Result Value Ref Range Status   Specimen Description   Final    PLEURAL Performed at [facility], [address] [place], [place] [address]    Special Requests   Final    PLEURAL Performed at [facility], [address] [place], [place] [address]    Gram Stain   Final    ABUNDANT WBC PRESENT,BOTH PMN AND MONONUCLEAR NO ORGANISMS SEEN    Culture   Final    NO GROWTH 3 DAYS Performed at [facility], [place] [address], [employer] [place], [place] [address]    Report Status 05/31/2021 FINAL  Final     Labs: BNP (last 3 results) Recent Labs    05/26/21 0927  BNP 196.2*   Basic Metabolic Panel: Recent Labs  Lab 05/29/21 0728 05/30/21 0538 05/31/21 0417 06/01/21 0430 06/02/21 0503  NA 138 137 138 138 137  K 3.1* 3.2* 3.7 3.3* 3.8  CL 101 102 103 104 103  CO2 _0 GLUCOSE 110* 102* 101* 111* 106*  BUN _1 CREATININE 0.50 0.52 0.40* 0.40* 0.40*  CALCIUM 8.9 8.8* 8.9 8.7* 9.1   Liver Function Tests: No results for input(s): AST, ALT, ALKPHOS, BILITOT, PROT, ALBUMIN in the last 168 hours. No results for input(s): LIPASE, AMYLASE in the last 168 hours. No results for input(s): AMMONIA in the last 168 hours. CBC: Recent Labs  Lab 05/30/21 0538 05/31/21 0417 06/01/21 0430 06/02/21 0503 06/03/21 0449  WBC 19.6* 18.0* 16.8* 14.3* 13.4*  NEUTROABS 15.1* 13.3* 12.7* 10.1* 9.5*  HGB 12.7 12.5 12.1 12.9 12.5  HCT 36.9 36.4 35.3* 38.1 36.5  MCV 97.6 97.8 97.8 97.4 98.6  PLT 516* 500* 505* 498* 483*   Cardiac Enzymes: No results for input(s): CKTOTAL, CKMB, CKMBINDEX, TROPONINI in the last 168 hours. BNP: Invalid input(s): POCBNP CBG: No results for input(s): GLUCAP in the last 168 hours. D-Dimer No results for input(s): DDIMER in the last 72 hours. Hgb A1c No results for input(s): HGBA1C in the last 72 hours. Lipid Profile No results for input(s): CHOL, HDL, LDLCALC, TRIG, CHOLHDL, LDLDIRECT in the last 72 hours. Thyroid function studies No results for input(s): TSH, T4TOTAL, T3FREE, THYROIDAB in the last 72 hours.  Invalid input(s): FREET3 Anemia work up No results for input(s): VITAMINB12, FOLATE, FERRITIN, TIBC, IRON, RETICCTPCT in the last 72  hours. Urinalysis    Component Value Date/Time   COLORURINE YELLOW (A) 05/26/2021 1200   APPEARANCEUR HAZY (A) 05/26/2021 1200   LABSPEC 1.033 (H) 05/26/2021 1200   PHURINE 7.0 05/26/2021 1200   GLUCOSEU NEGATIVE 05/26/2021 1200   HGBUR SMALL (A) 05/26/2021 1200  BILIRUBINUR NEGATIVE 05/26/2021 1200   KETONESUR NEGATIVE 05/26/2021 1200   PROTEINUR NEGATIVE 05/26/2021 1200   NITRITE NEGATIVE 05/26/2021 1200   LEUKOCYTESUR LARGE (A) 05/26/2021 1200   Sepsis Labs Invalid input(s): PROCALCITONIN,  WBC,  LACTICIDVEN Microbiology Recent Results (from the past 240 hour(s))  Resp Panel by RT-PCR (Flu A&B, Covid) Nasopharyngeal Swab     Status: None   Collection Time: 05/26/21  9:40 AM   Specimen: Nasopharyngeal Swab; Nasopharyngeal(NP) swabs in vial transport medium  Result Value Ref Range Status   SARS Coronavirus 2 by RT PCR NEGATIVE NEGATIVE Final    Comment: (NOTE) SARS-CoV-2 target nucleic acids are NOT DETECTED.  The SARS-CoV-2 RNA is generally detectable in upper respiratory specimens during the acute phase of infection. The lowest concentration of SARS-CoV-2 viral copies this assay can detect is 138 copies/mL. A negative result does not preclude SARS-Cov-2 infection and should not be used as the sole basis for treatment or other patient management decisions. A negative result may occur with  improper specimen collection/handling, submission of specimen other than nasopharyngeal swab, presence of viral mutation(s) within the areas targeted by this assay, and inadequate number of viral copies(<138 copies/mL). A negative result must be combined with clinical observations, patient history, and epidemiological information. The expected result is Negative.  Fact Sheet for Patients:  EntrepreneurPulse.com.au  Fact Sheet for Healthcare Providers:  IncredibleEmployment.be  This test is no t yet approved or cleared by the [place] FDA and   has been authorized for detection and/or diagnosis of SARS-CoV-2 by FDA under an Emergency Use Authorization (EUA). This EUA will remain  in effect (meaning this test can be used) for the duration of the COVID-19 declaration under Section 564(b)(1) of the Act, 21 U.S.C.section 360bbb-3(b)(1), unless the authorization is terminated  or revoked sooner.       Influenza A by PCR NEGATIVE NEGATIVE Final   Influenza B by PCR NEGATIVE NEGATIVE Final    Comment: (NOTE) The Xpert Xpress SARS-CoV-2/FLU/RSV plus assay is intended as an aid in the diagnosis of influenza from Nasopharyngeal swab specimens and should not be used as a sole basis for treatment. Nasal washings and aspirates are unacceptable for Xpert Xpress SARS-CoV-2/FLU/RSV testing.  Fact Sheet for Patients: EntrepreneurPulse.com.au  Fact Sheet for Healthcare Providers: IncredibleEmployment.be  This test is not yet approved or cleared by the [place] FDA and has been authorized for detection and/or diagnosis of SARS-CoV-2 by FDA under an Emergency Use Authorization (EUA). This EUA will remain in effect (meaning this test can be used) for the duration of the COVID-19 declaration under Section 564(b)(1) of the Act, 21 U.S.C. section 360bbb-3(b)(1), unless the authorization is terminated or revoked.  Performed at [facility], [address] [place], [place] [address]   Blood culture (routine x 2)     Status: None   Collection Time: 05/26/21 10:08 AM   Specimen: BLOOD  Result Value Ref Range Status   Specimen Description BLOOD BLOOD RIGHT FOREARM  Final   Special Requests   Final    BOTTLES DRAWN AEROBIC AND ANAEROBIC Blood Culture results may not be optimal due to an excessive volume of blood received in culture bottles   Culture   Final    NO GROWTH 5 DAYS Performed at [facility], [address], [place], [place] [address]    Report Status 05/31/2021 FINAL   Final  Blood culture (routine x 2)     Status: None   Collection Time: 05/26/21 10:08 AM  Specimen: BLOOD  Result Value Ref Range Status   Specimen Description BLOOD BLOOD RIGHT ARM  Final   Special Requests   Final    BOTTLES DRAWN AEROBIC AND ANAEROBIC Blood Culture adequate volume   Culture   Final    NO GROWTH 5 DAYS Performed at [facility], [address], [place], [place] [address]    Report Status 05/31/2021 FINAL  Final  Urine Culture     Status: None   Collection Time: 05/26/21 12:00 PM   Specimen: Urine, Random  Result Value Ref Range Status   Specimen Description   Final    URINE, RANDOM Performed at [facility], [address], [place], [place] [address]    Special Requests   Final    Normal Performed at [facility], [address], [place], [place] [address]    Culture   Final    NO GROWTH Performed at [facility], [place] [address] [place], [place] [address]    Report Status 05/28/2021 FINAL  Final  Aerobic/Anaerobic Culture (surgical/deep wound)     Status: None   Collection Time: 05/26/21  2:26 PM   Specimen: Abscess  Result Value Ref Range Status   Specimen Description ABSCESS RIGHT PLEURAL  Final   Special Requests SYRINGE  Final   Gram Stain   Final    ABUNDANT WBC PRESENT, PREDOMINANTLY PMN NO ORGANISMS SEEN    Culture   Final    FEW FUSOBACTERIUM NUCLEATUM BETA LACTAMASE NEGATIVE Performed at [facility], [place] [address], [place], [place] [address]    Report Status 05/30/2021 FINAL  Final  Body fluid culture w Gram Stain     Status: None   Collection Time: 05/27/21 11:24 AM   Specimen: Pleura; Body Fluid  Result Value Ref Range Status   Specimen Description   Final    PLEURAL Performed at [facility], [address] [place], [place] [address]    Special Requests   Final    PLEURAL Performed at [facility], [address] [place], [place] [address]    Gram Stain   Final     ABUNDANT WBC PRESENT,BOTH PMN AND MONONUCLEAR NO ORGANISMS SEEN    Culture   Final    NO GROWTH 3 DAYS Performed at [facility], [place] [address] [place],  [address]    Report Status 05/31/2021 FINAL  Final     Time coordinating discharge: Over 30 minutes  SIGNED:   Sidney Ace, MD  [facility] 06/03/2021, 1:11 PM Pager   If 7PM-7AM, please contact night-coverage

## 2021-06-03 NOTE — Procedures (Signed)
Pre procedural Dx: Right sided empyema Post procedural Dx: Same  Post removal of right sided chest tube. Post procedural chest X-ray demonstrates a small presumed ex-vacuo PTX which will be followed on subsequent CXR.  EBL: None Complications: None immediate  Katherina Right, MD Pager #: [phone]

## 2021-06-03 NOTE — Clinical Social Work Note (Addendum)
Per MD, plan on discharge home today. Patient does not have insurance. GoodRx prices obtained to get prescriptions at [facility]: Augmentin $87.96, Robaxin $9.99 and Nicoderm CQ $95.51. Patient is aware of prices and confirmed she can pay for them. Coupons put in discharge packet. No further concerns. CSW signing off.  Charlynn Court, CSW [phone]   3:29 pm: Printed PCP booklet and [facility] application to unit. RN will give to patient. CSW signing off.  Charlynn Court, CSW [phone]

## 2021-06-03 NOTE — Progress Notes (Signed)
Removed in Radiology

## 2021-06-03 NOTE — Discharge Instructions (Signed)
Lung Abscess  A lung abscess is a space (cavity) that forms inside the lung and fills with dead tissue and pus. It results from an infection that gets into the lung. A lung abscess can cause symptoms such as fatigue, fever, and a cough that brings up fluid from the lungs (sputum). A lung abscess usually occurs in just one lung. A person who has a lung abscess may need treatment for several weeks. A lung abscess that lasts longer than 6 weeks is considered chronic. What are the causes? This condition is usually caused by bacteria. In most cases, it is caused by more than one type of bacteria. Bacteria can get into the lungs by:  Saliva, vomit, or liquid that is contaminated with bacteria being inhaled (aspirated) into the lungs.  Spreading from a blood infection (sepsis). This can sometimes cause abscesses in both lungs. What increases the risk? The following factors may make you more likely to develop this condition:  Having a medical condition that weakens the body's defense system (immune system).  Having a dental, gum, or sinus infection.  Having poor oral hygiene.  Aspiration. You may be at risk for aspiration if you: ? Have a nervous system condition that interferes with coughing or swallowing. ? Have a tube in your airway for breathing (tracheal tube or endotracheal tube). ? Are very drowsy or unconscious because of a medicine (sedative) or drinking too much alcohol.  Chronic lung conditions, such as chronic obstructive pulmonary disease (COPD), cancer, or cystic fibrosis.  Aging. What are the signs or symptoms? Early symptoms of this condition are similar to the symptoms of other lung infections, such as pneumonia. These include:  Fever.  Chills.  Night sweats.  A cough that brings up sputum.  Shortness of breath.  Fatigue. Symptoms of a chronic lung abscess also include:  Weight loss.  Chest pain.  Coughing up sputum that smells bad, is discolored, or is tinged  with blood. How is this diagnosed? This condition may be diagnosed based on:  A physical exam and medical history. During the exam, your health care provider will use a stethoscope to listen to your lungs and check for abnormal sounds in the abscess area.  Medical tests, such as: ? Chest X-rays to check for a cavity that is filled with fluid and air. ? Blood tests to look for signs of infection. ? Blood and sputum cultures to test for bacteria. ? Blood tests to measure the oxygen level in your blood (arterial blood gases, or ABG). ? A procedure to examine your lung and take samples of fluid or tissue using an endoscopic tube (bronchoscope). ? Imaging studies of your lung, such as a CT scan or ultrasound. How is this treated? This condition may be treated with:  Antibiotic medicines. This is usually the first treatment. ? First, these medicines may be given directly into a vein through an IV. Treatment with antibiotics may start with an antibiotic that is known to work against many different kinds of bacteria (broad-spectrum antibiotic). ? Second, your IV antibiotics may be changed if tests identify the germs and show that another type of antibiotic may be more effective. You may have to continue antibiotics in the hospital or at home for several weeks.  Antifungal medicines. These may be given if a fungus is present in the abscess.  Supportive care, such as: ? IV fluids and nutrition. ? Oxygen therapy.  A procedure to drain the abscess using a bronchoscope or drainage tube.  Open   lung surgery to drain the abscess and remove dead lung tissue. This is rare. Follow these instructions at home: Medicines  Take over-the-counter and prescription medicines only as told by your health care provider.  Take your antibiotic medicine as told by your health care provider. Do not stop taking the antibiotic even if you start to feel better.   Lifestyle  Do not abuse drugs or alcohol.  Do not  use any products that contain nicotine or tobacco, such as cigarettes and e-cigarettes. If you need help quitting, ask your health care provider.  Brush your teeth every morning and night with fluoride toothpaste and floss once a day. See a dentist regularly.  Eat a healthy diet. Ask your health care provider what foods are right for you.  Rest at home until your health care provider says that you can return to your normal activities. General instructions  Drink enough fluids to keep your urine pale yellow.  Do breathing exercises as told by your health care provider. This may include: ? Changing positions to help drain the infected area (postural drainage). ? Using a breathing device (incentive spirometer) to help you take very deep breaths.  Keep all follow-up visits as told by your health care provider. This is important. Get help right away if you:  Have a fever.  Have a cough that is not going away or is getting worse.  Cough up blood.  Have shortness of breath that is not going away or is getting worse.  Have chest pain. These symptoms may represent a serious problem that is an emergency. Do not wait to see if the symptoms will go away. Get medical help right away. Call your local emergency services (911 in the [place] Do not drive yourself to the hospital. Summary  A lung abscess is a cavity in the lung that fills with dead tissue and pus due to an infection.  Antibiotic therapy is the main treatment for a lung abscess. Do not stop taking the antibiotic even if you start to feel better.  As you heal, your health care provider may recommend special lung exercises to help improve your breathing and encourage drainage of the infection from your lung.  Get help right away if you develop a fever or have a cough that is not going away or is getting worse. This information is not intended to replace advice given to you by your health care provider. Make sure you discuss any  questions you have with your health care provider. Document Revised: 01/21/2018 Document Reviewed: 12/30/2017 Elsevier Patient Education  2021 [employer].

## 2021-06-12 ENCOUNTER — Other Ambulatory Visit: Payer: Self-pay | Admitting: Specialist

## 2021-06-12 DIAGNOSIS — R0602 Shortness of breath: Secondary | ICD-10-CM

## 2021-06-12 DIAGNOSIS — J90 Pleural effusion, not elsewhere classified: Secondary | ICD-10-CM

## 2021-06-13 ENCOUNTER — Other Ambulatory Visit: Payer: Self-pay | Admitting: Specialist

## 2021-06-13 ENCOUNTER — Ambulatory Visit
Admission: RE | Admit: 2021-06-13 | Discharge: 2021-06-13 | Disposition: A | Discharge status: Home / Self Care | Payer: Self-pay | Source: Ambulatory Visit | Attending: Specialist | Admitting: Specialist

## 2021-06-13 ENCOUNTER — Other Ambulatory Visit: Payer: Self-pay

## 2021-06-13 DIAGNOSIS — R0602 Shortness of breath: Secondary | ICD-10-CM | POA: Insufficient documentation

## 2021-06-13 DIAGNOSIS — J90 Pleural effusion, not elsewhere classified: Secondary | ICD-10-CM | POA: Insufficient documentation

## 2021-11-07 IMAGING — DX DG CHEST 1V PORT
1 series · 1 of 1 positions shown · non-contrast
Comparison: Chest x-ray 05/26/2021, CT chest 05/26/2021

CLINICAL DATA: Syncope

EXAM:
PORTABLE CHEST 1 VIEW

[chest ap]
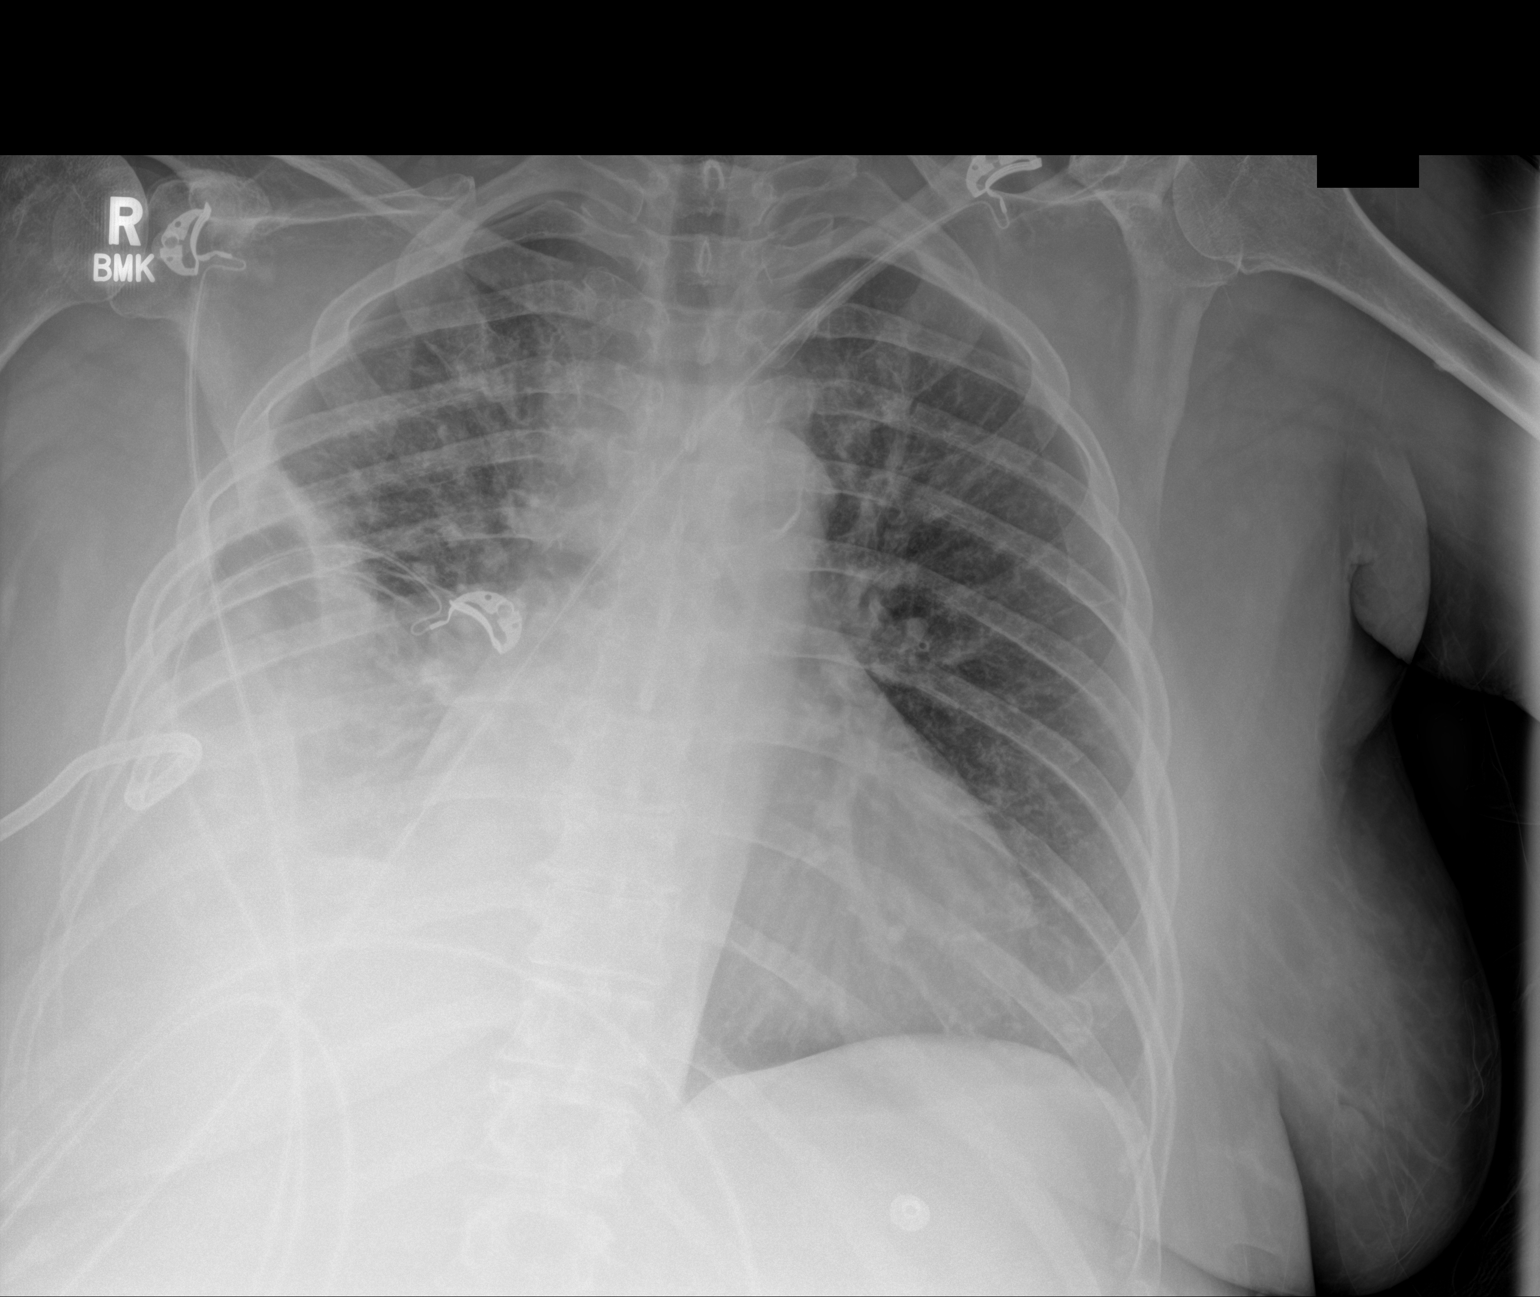

[1 of 1 positions shown; findings below may reference images not displayed]

FINDINGS: Interval placement of right lower pigtail drainage catheter. Slight
decreased loculated right pleural effusion. Mild cardiomegaly with
increased vascular congestion and probable mild edema. Aortic
atherosclerosis. Persistent airspace disease at the right middle
lobe and right base
IMPRESSION: 1. Interval placement of right lower chest drainage catheter with
slight decreased loculated right pleural collection
2. Mild cardiomegaly with interim vascular congestion and mild
pulmonary edema
3. Consolidation at the right middle lobe and right base

## 2021-11-23 IMAGING — US US CHEST/MEDIASTINUM
1 series · 4 of 4 positions shown · non-contrast
Comparison: None.

CLINICAL DATA: 55-year-old woman with shortness of breath and right
pleural effusion seen on chest radiograph presents to interventional
radiology for thoracentesis.

EXAM:
CHEST ULTRASOUND

[Series 1: us chest/mediastinum · 0.25mm/px · 4 of 4 slices shown]
[im 1/4]
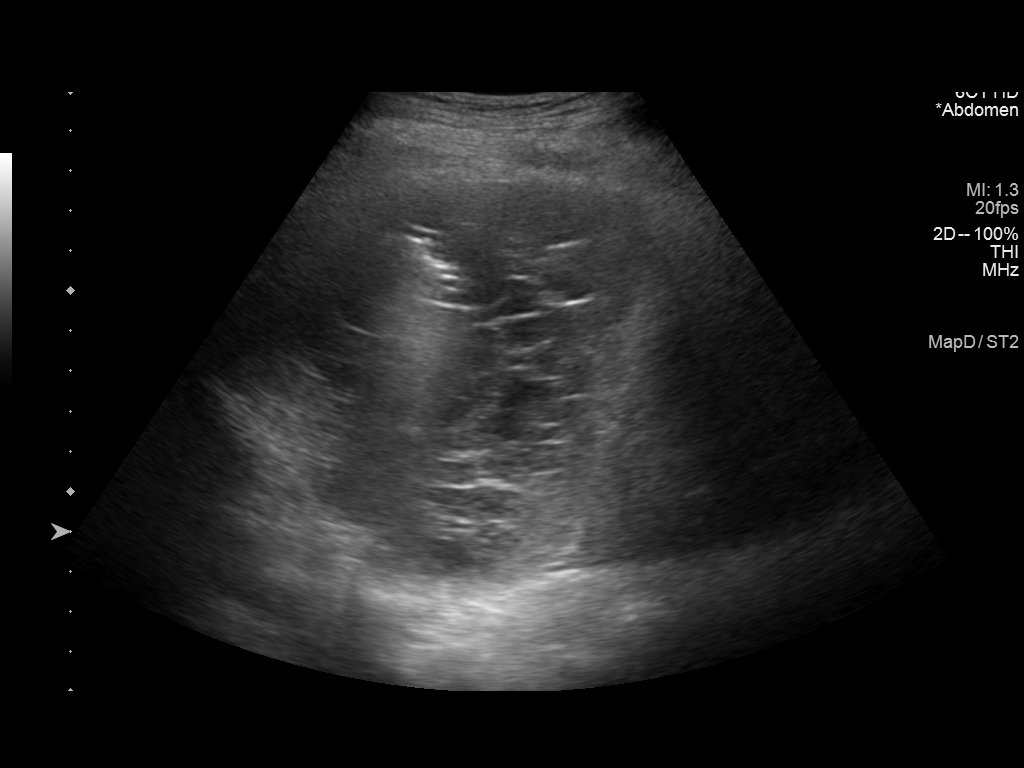
[im 2/4]
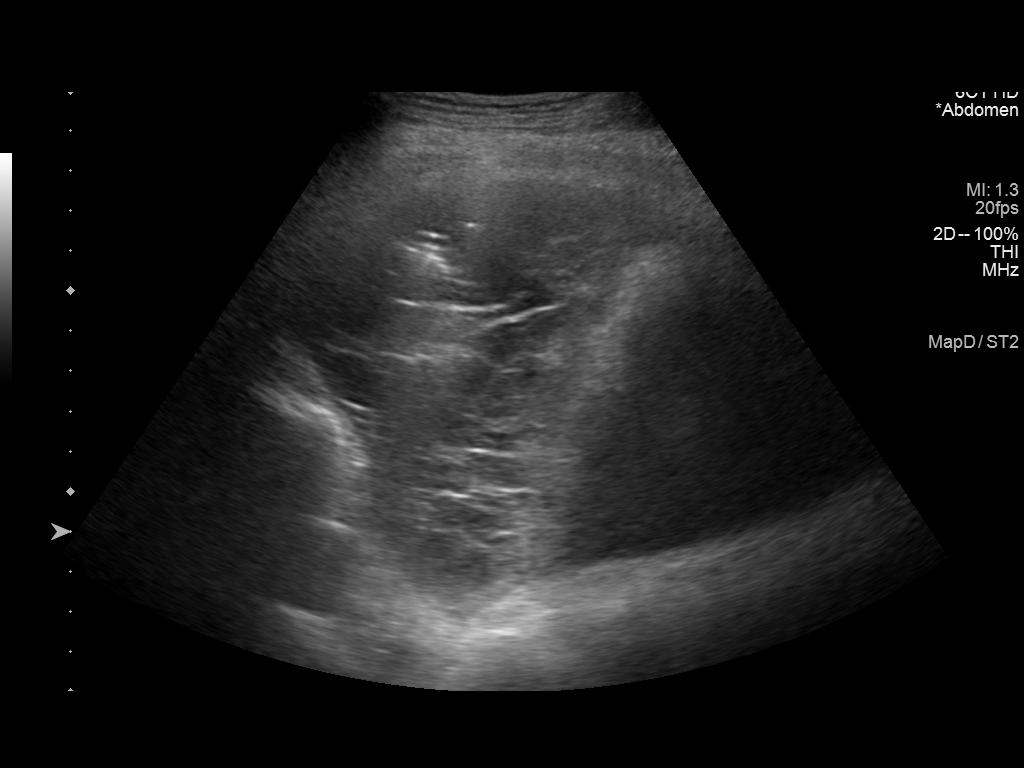
[im 3/4]
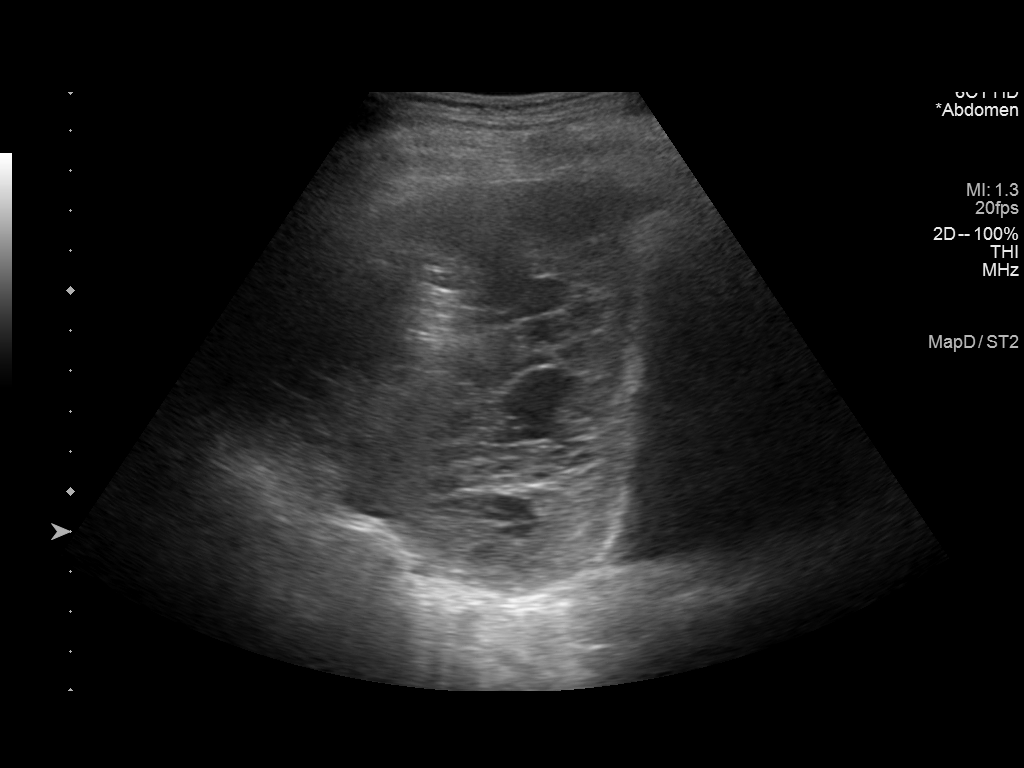
[im 4/4]
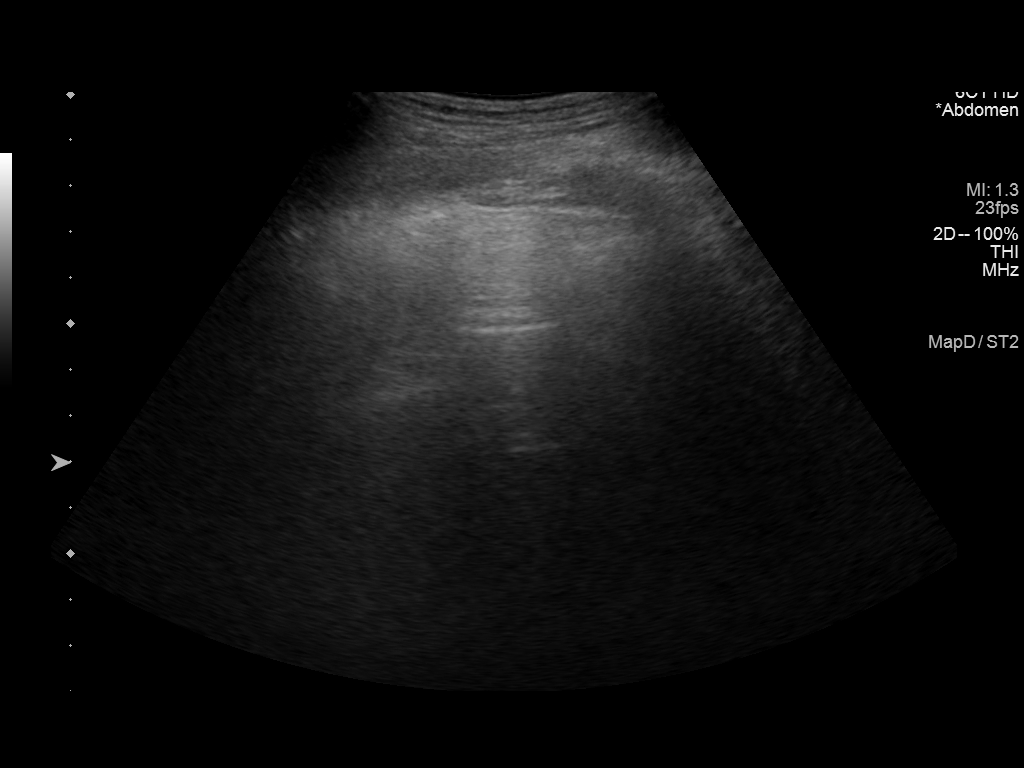

[4 of 4 positions shown; findings below may reference images not displayed]

FINDINGS: Ultrasound evaluation of the right chest demonstrates small
heterogeneous complex collection with minimal fluid.
IMPRESSION: Heterogeneous complex collection with minimal fluid identified in
the right pleural space is not amenable to percutaneous drainage.
